# Patient Record
Sex: Male | Born: 1985 | Hispanic: No | Marital: Single | Smoking: Never smoker
Health system: Southern US, Community
[De-identification: ages and names within clinical notes are randomized; demographics above are authoritative.]

## PROBLEM LIST (undated history)

## (undated) DIAGNOSIS — N471 Phimosis: Secondary | ICD-10-CM

## (undated) HISTORY — PX: NO PAST SURGERIES: SHX2092

---

## 2012-01-31 ENCOUNTER — Emergency Department (INDEPENDENT_AMBULATORY_CARE_PROVIDER_SITE_OTHER): Payer: Self-pay

## 2012-01-31 ENCOUNTER — Emergency Department (INDEPENDENT_AMBULATORY_CARE_PROVIDER_SITE_OTHER)
Admission: EM | Admit: 2012-01-31 | Discharge: 2012-01-31 | Disposition: A | Discharge status: Home / Self Care | Payer: 59 | Source: Home / Self Care | Attending: Emergency Medicine | Admitting: Emergency Medicine

## 2012-01-31 ENCOUNTER — Encounter (HOSPITAL_COMMUNITY): Payer: Self-pay | Admitting: *Deleted

## 2012-01-31 DIAGNOSIS — S93409A Sprain of unspecified ligament of unspecified ankle, initial encounter: Secondary | ICD-10-CM

## 2012-01-31 MED ORDER — DICLOFENAC SODIUM 75 MG PO TBEC: 75.0000 mg | DELAYED_RELEASE_TABLET | Freq: Two times a day (BID) | ORAL | Status: AC

## 2012-01-31 NOTE — ED Notes (Signed)
Pt  Reports  While  Playing  Soccer  yest  He  Was  Running and  Twisted   His  r  Ankle  -  He  Has  Pain /  Swelling to  The  Affected  Ankle  He  Has  An ankle  Brace  On  Upon arrival          He  Is  Able  To  Bear  Weight on the  Affected  Ankle  He   denys  Any other  Injury

## 2012-01-31 NOTE — ED Provider Notes (Signed)
Chief Complaint  Patient presents with  . Ankle Pain    History of Present Illness:   The patient is a 26 year old [place] male who injured his left ankle yesterday playing soccer. He twisted the ankle and now has pain and swelling over the medial and lateral malleolus. He is able to walk but it hurts somewhat. He denies any numbness or tingling.  Review of Systems:  Other than noted above, the patient denies any of the following symptoms: Systemic:  No fevers, chills, sweats, or aches.  No fatigue or tiredness. Musculoskeletal:  No joint pain, arthritis, bursitis, swelling, back pain, or neck pain. Neurological:  No muscular weakness, paresthesias, headache, or trouble with speech or coordination.  No dizziness.   PMFSH:  Past medical history, family history, social history, meds, and allergies were reviewed.  Physical Exam:   Vital signs:  BP 128/89  Pulse 74  Temp 98.1 F (36.7 C) (Oral)  Resp 18  SpO2 100% Gen:  Alert and oriented times 3.  In no distress. Musculoskeletal: He has swelling and pain to palpation over the medial and lateral malleolar. Talar tilt and anterior drawer signs were negative. The ankle has a full range of motion with some pain on movement. Otherwise, all joints had a full a ROM with no swelling, bruising or deformity.  No edema, pulses full. Extremities were warm and pink.  Capillary refill was brisk.  Skin:  Clear, warm and dry.  No rash. Neuro:  Alert and oriented times 3.  Muscle strength was normal.  Sensation was intact to light touch.   Radiology:  Dg Ankle Complete Right  01/31/2012  *RADIOLOGY REPORT*  Clinical Data: Medial and lateral right ankle pain following a twisting injury yesterday.  RIGHT ANKLE - COMPLETE 3+ VIEW  Comparison: None.  Findings: Diffuse soft tissue swelling.  No fracture, dislocation or effusion.  IMPRESSION: No fracture.   Original Report Authenticated By: Darrol Angel, M.D.    I reviewed the images independently and  personally and concur with the radiologist's findings.  Course in Urgent Care Center:   The patient was placed in an ASO brace.  Assessment:  The encounter diagnosis was Ankle sprain.  Plan:   1.  The following meds were prescribed:   New Prescriptions   DICLOFENAC (VOLTAREN) 75 MG EC TABLET    Take 1 tablet (75 mg total) by mouth 2 (two) times daily.   2.  The patient was instructed in symptomatic care, including rest and activity, elevation, application of ice and compression.  Appropriate handouts were given. 3.  The patient was told to return if becoming worse in any way, if no better in 3 or 4 days, and given some red flag symptoms that would indicate earlier return.   4.  The patient was told to follow up with Dr. Leonides Grills if no better in 2 weeks.   Reuben Likes, MD 01/31/12 2059

## 2014-12-10 ENCOUNTER — Emergency Department (INDEPENDENT_AMBULATORY_CARE_PROVIDER_SITE_OTHER)
Admission: EM | Admit: 2014-12-10 | Discharge: 2014-12-10 | Disposition: A | Discharge status: Home / Self Care | Payer: 59 | Source: Home / Self Care | Attending: Emergency Medicine | Admitting: Emergency Medicine

## 2014-12-10 ENCOUNTER — Encounter (HOSPITAL_COMMUNITY): Payer: Self-pay | Admitting: *Deleted

## 2014-12-10 DIAGNOSIS — N471 Phimosis: Secondary | ICD-10-CM

## 2014-12-10 LAB — POCT URINALYSIS DIP (DEVICE)
Bilirubin Urine: NEGATIVE
Glucose, UA: NEGATIVE mg/dL
Hgb urine dipstick: NEGATIVE
Ketones, ur: NEGATIVE mg/dL
Leukocytes, UA: NEGATIVE
Nitrite: NEGATIVE
PH: 6 (ref 5.0–8.0)
PROTEIN: NEGATIVE mg/dL
UROBILINOGEN UA: 0.2 mg/dL (ref 0.0–1.0)

## 2014-12-10 MED ORDER — NYSTATIN 100000 UNIT/GM EX CREA: TOPICAL_CREAM | CUTANEOUS | Status: DC

## 2014-12-10 NOTE — ED Notes (Addendum)
PT  IS  UNCIRCUMSIZED  HE  REPORTS       HE    NOW  IS  DRIBBING    AND     THE  FORESKIN  IS  TIGHTER   HE  REPORTS  SOME ITCHING  AND  BURNING  PRESENT

## 2014-12-10 NOTE — ED Provider Notes (Signed)
CSN: [id number]     Arrival date & time 12/10/14  1328 History   First MD Initiated Contact with Patient 12/10/14 1444     Chief Complaint  Patient presents with  . Penis Pain   (Consider location/radiation/quality/duration/timing/severity/associated sxs/prior Treatment) HPI Comments: 29 year old male states that since he was 29 years old he has had problems retracting his foreskin. The older he gets the smaller the opening in his foreskin becomes. He denies penile drainage or any type of infection. There are no exudates, abnormal fluids, discoloration or bleeding.   History reviewed. No pertinent past medical history. History reviewed. No pertinent past surgical history. History reviewed. No pertinent family history. History  Substance Use Topics  . Smoking status: Never Smoker   . Smokeless tobacco: Not on file  . Alcohol Use: No    Review of Systems  Constitutional: Negative.   Genitourinary: Positive for difficulty urinating. Negative for urgency, frequency, hematuria, penile swelling, genital sores and testicular pain.  Neurological: Negative.   All other systems reviewed and are negative.   Allergies  Review of patient's allergies indicates no known allergies.  Home Medications   Prior to Admission medications   Medication Sig Start Date End Date Taking? Authorizing Provider  nystatin cream (MYCOSTATIN) Apply to affected area 2 times daily 12/10/14   Hayden Rasmussenavid Hau Sanor, NP   BP 122/76 mmHg  Pulse 77  Temp(Src) 97.9 F (36.6 C) (Oral)  Resp 16  SpO2 99% Physical Exam  Constitutional: He is oriented to person, place, and time. He appears well-developed and well-nourished. No distress.  Pulmonary/Chest: No respiratory distress.  Genitourinary: No penile tenderness.  Foreskin has approximately 2-3 mm opening over the glans. More than likely upon urinating there is retention of urine between the glans and the foreskin. This condition may be opportunistic for yeast type of  infection. There is no visual evidence such as erythema or drainage.  Neurological: He is alert and oriented to person, place, and time.  Skin: Skin is warm and dry.  Psychiatric: He has a normal mood and affect.  Nursing note and vitals reviewed.   ED Course  Procedures (including critical care time) Labs Review Labs Reviewed  POCT URINALYSIS DIP (DEVICE)    Imaging Review No results found.   MDM   1. Phimosis    Refer to urology Nystatin cream for possible candida infection    Hayden Rasmussenavid Cameran Ahmed, NP 12/10/14 1647

## 2014-12-10 NOTE — Discharge Instructions (Signed)
H?p bao quy ??u (Phimosis) H?p bao quy ??u l tnh tr?ng kht l?i (th?t l?i) vng da quy ??u ? ??u d??ng v?t. ? nam gi?i ch?a c?t bao quy ??u, bao quy ??u c th? b? h?p ??n m?c khng th? l?n qua ??u d??ng v?t. Tnh tr?ng ny ph? bi?n ? cc b trai (cho ??n 4 tu?i) nh?ng c?ng c th? x?y ra ? b?t k? ?? tu?i no. Khng c?n ?i?u tr? ngay l?p t?c khi v?n c th? ?i ti?u ???c. Tnh tr?ng ny c th? t? ?? ?i theo th?i gian. Tnh tr?ng ny c th? x?y ra sau khi b? nhi?m trng ho?c t?n th??ng, ho?c x?y ra do v? sinh km ? pha trong bao quy ??u.  ?I?U TR?  Vi?c ?i?u tr? h?p bao quy ??u th??ng b?t ??u khi tr? ???c 2 tu?i. Cc ?i?u tr? b?o t?n c th? bao g?m kem v thu?c m? steroid. C?t b? m?t ph?n bao quy ??u (c?t bao quy ??u) c th? c?n thi?t trong cc tr??ng h?p h?p n?ng d?n ??n vi?c cung c?p mu km ho?c khng cung c?p ???c mu ??n ??u d??ng v?t. H??NG D?N CH?M [place] T?I NH   Khng c? l?n bao quy ??u. Lm nh? v?y c th? gy s?o v lm tnh tr?ng tr?m tr?ng h?n.  Th??ng xuyn r?a s?ch pha trong bao quy ??u. H??ng d?n c? th? cho tr? em ? tr? em ch?a c?t bao quy ??u, bao quy ??u th??ng cht h?p. Kandis CockingBao quy ??u th??ng khng ?? r?ng ?? l?n v? pha sau cho ??n khi b ???c t nht 19 thng tu?i. Tr??c th?i ?i?m ?, ?i?u tr? theo h??ng d?n c?a chuyn gia ch?m [place] s?c kh?e. Sau ?, qu v? c th? ko nh? nhng da quy ??u trong lc t?m ?? r?a d??ng v?t.  ?I KHM N?U:   C v?t ??, s?ng, ho?c r? d?ch ? bao quy ??u. ?y l nh?ng d?u hi?u nhi?m trng.  ?au khi ?i ti?u. NGAY L?P T?C ?I KHM N?U:  Ch?a ?i ti?u ???c trong 24 ti?ng.  B? s?t. ??M B?O QU V?:  Hi?u r cc h??ng d?n ny.  S? theo di tnh tr?ng b?nh.  S? yu c?u tr? gip ngay n?u tnh tr?ng b?nh tr?m tr?ng h?n. Document Released: 05/24/2005 Document Revised: 05/29/2013 [facility] Patient Information 2015 [place], [place]. This information is not intended to replace advice given to you by your health care provider. Make sure you discuss any questions you  have with your health care provider.

## 2014-12-20 ENCOUNTER — Other Ambulatory Visit: Payer: Self-pay | Admitting: Urology

## 2015-01-02 ENCOUNTER — Encounter (HOSPITAL_BASED_OUTPATIENT_CLINIC_OR_DEPARTMENT_OTHER): Payer: Self-pay | Admitting: *Deleted

## 2015-01-03 ENCOUNTER — Encounter (HOSPITAL_BASED_OUTPATIENT_CLINIC_OR_DEPARTMENT_OTHER): Payer: Self-pay | Admitting: *Deleted

## 2015-01-03 NOTE — Progress Notes (Signed)
NPO AFTER MN. ARRIVE AT 0730. NEEDS HG. 

## 2015-01-06 ENCOUNTER — Ambulatory Visit (HOSPITAL_BASED_OUTPATIENT_CLINIC_OR_DEPARTMENT_OTHER)
Admission: RE | Admit: 2015-01-06 | Discharge: 2015-01-06 | Disposition: A | Discharge status: Home / Self Care | Payer: 59 | Source: Ambulatory Visit | Attending: Urology | Admitting: Urology

## 2015-01-06 ENCOUNTER — Ambulatory Visit (HOSPITAL_BASED_OUTPATIENT_CLINIC_OR_DEPARTMENT_OTHER): Payer: 59 | Admitting: Anesthesiology

## 2015-01-06 ENCOUNTER — Encounter (HOSPITAL_BASED_OUTPATIENT_CLINIC_OR_DEPARTMENT_OTHER)
Admission: RE | Disposition: A | Discharge status: Home / Self Care | Payer: Self-pay | Source: Ambulatory Visit | Attending: Urology

## 2015-01-06 ENCOUNTER — Encounter (HOSPITAL_BASED_OUTPATIENT_CLINIC_OR_DEPARTMENT_OTHER): Payer: Self-pay | Admitting: Anesthesiology

## 2015-01-06 DIAGNOSIS — F419 Anxiety disorder, unspecified: Secondary | ICD-10-CM | POA: Insufficient documentation

## 2015-01-06 DIAGNOSIS — Z79899 Other long term (current) drug therapy: Secondary | ICD-10-CM | POA: Diagnosis not present

## 2015-01-06 DIAGNOSIS — N471 Phimosis: Secondary | ICD-10-CM | POA: Insufficient documentation

## 2015-01-06 DIAGNOSIS — F329 Major depressive disorder, single episode, unspecified: Secondary | ICD-10-CM | POA: Diagnosis not present

## 2015-01-06 HISTORY — DX: Phimosis: N47.1

## 2015-01-06 HISTORY — PX: CIRCUMCISION: SHX1350

## 2015-01-06 LAB — POCT I-STAT 4, (NA,K, GLUC, HGB,HCT)
Glucose, Bld: 98 mg/dL (ref 65–99)
HCT: 52 % (ref 39.0–52.0)
Hemoglobin: 17.7 g/dL — ABNORMAL HIGH (ref 13.0–17.0)
Potassium: 4 mmol/L (ref 3.5–5.1)
Sodium: 142 mmol/L (ref 135–145)

## 2015-01-06 SURGERY — CIRCUMCISION, ADULT
Anesthesia: General | Site: Penis

## 2015-01-06 MED ORDER — CEFAZOLIN SODIUM-DEXTROSE 2-3 GM-% IV SOLR
2.0000 g | INTRAVENOUS | Status: AC
  Administered 2015-01-06: 2 g via INTRAVENOUS
  Filled 2015-01-06: qty 50

## 2015-01-06 MED ORDER — OXYCODONE HCL 10 MG PO TABS
10.0000 mg | ORAL_TABLET | ORAL | Status: DC | PRN
Start: 2015-01-06 — End: 2021-06-04

## 2015-01-06 MED ORDER — FENTANYL CITRATE (PF) 100 MCG/2ML IJ SOLN
INTRAMUSCULAR | Status: DC | PRN
  Administered 2015-01-06 (×8): 25 ug via INTRAVENOUS

## 2015-01-06 MED ORDER — LIDOCAINE HCL (CARDIAC) 20 MG/ML IV SOLN
INTRAVENOUS | Status: DC | PRN
  Administered 2015-01-06: 60 mg via INTRAVENOUS

## 2015-01-06 MED ORDER — FENTANYL CITRATE (PF) 100 MCG/2ML IJ SOLN
INTRAMUSCULAR | Status: AC
  Filled 2015-01-06: qty 4

## 2015-01-06 MED ORDER — MIDAZOLAM HCL 5 MG/5ML IJ SOLN
INTRAMUSCULAR | Status: DC | PRN
  Administered 2015-01-06 (×2): 1 mg via INTRAVENOUS

## 2015-01-06 MED ORDER — BUPIVACAINE HCL 0.5 % IJ SOLN
INTRAMUSCULAR | Status: DC | PRN
  Administered 2015-01-06: 10 mL

## 2015-01-06 MED ORDER — ONDANSETRON HCL 4 MG/2ML IJ SOLN
INTRAMUSCULAR | Status: DC | PRN
  Administered 2015-01-06: 4 mg via INTRAVENOUS

## 2015-01-06 MED ORDER — KETOROLAC TROMETHAMINE 30 MG/ML IJ SOLN
INTRAMUSCULAR | Status: DC | PRN
  Administered 2015-01-06: 30 mg via INTRAVENOUS

## 2015-01-06 MED ORDER — ACETAMINOPHEN 10 MG/ML IV SOLN
INTRAVENOUS | Status: DC | PRN
  Administered 2015-01-06: 1000 mg via INTRAVENOUS

## 2015-01-06 MED ORDER — MIDAZOLAM HCL 2 MG/2ML IJ SOLN
INTRAMUSCULAR | Status: AC
  Filled 2015-01-06: qty 4

## 2015-01-06 MED ORDER — CEFAZOLIN SODIUM 1-5 GM-% IV SOLN
1.0000 g | INTRAVENOUS | Status: DC
  Filled 2015-01-06: qty 50

## 2015-01-06 MED ORDER — DEXAMETHASONE SODIUM PHOSPHATE 10 MG/ML IJ SOLN
INTRAMUSCULAR | Status: DC | PRN
  Administered 2015-01-06: 10 mg via INTRAVENOUS

## 2015-01-06 MED ORDER — LACTATED RINGERS IV SOLN
INTRAVENOUS | Status: DC
  Administered 2015-01-06 (×2): via INTRAVENOUS
  Filled 2015-01-06: qty 1000

## 2015-01-06 MED ORDER — CEFAZOLIN SODIUM-DEXTROSE 2-3 GM-% IV SOLR
INTRAVENOUS | Status: AC
Start: 2015-01-06 — End: 2015-01-06
  Filled 2015-01-06: qty 50

## 2015-01-06 MED ORDER — BACITRACIN-NEOMYCIN-POLYMYXIN 400-5-5000 EX OINT
TOPICAL_OINTMENT | CUTANEOUS | Status: DC | PRN
  Administered 2015-01-06: 1 via TOPICAL

## 2015-01-06 MED ORDER — HYDROMORPHONE HCL 1 MG/ML IJ SOLN
0.2500 mg | INTRAMUSCULAR | Status: DC | PRN
  Filled 2015-01-06: qty 1

## 2015-01-06 MED ORDER — PROPOFOL 10 MG/ML IV BOLUS
INTRAVENOUS | Status: DC | PRN
  Administered 2015-01-06: 200 mg via INTRAVENOUS

## 2015-01-06 SURGICAL SUPPLY — 30 items
BANDAGE CO FLEX L/F 1IN X 5YD (GAUZE/BANDAGES/DRESSINGS) IMPLANT
BANDAGE CO FLEX L/F 2IN X 5YD (GAUZE/BANDAGES/DRESSINGS) ×2 IMPLANT
BLADE SURG 15 STRL LF DISP TIS (BLADE) ×1 IMPLANT
BLADE SURG 15 STRL SS (BLADE) ×1
BNDG CONFORM 2 STRL LF (GAUZE/BANDAGES/DRESSINGS) ×2 IMPLANT
COVER BACK TABLE 60X90IN (DRAPES) ×2 IMPLANT
COVER MAYO STAND STRL (DRAPES) ×2 IMPLANT
DRAPE PED LAPAROTOMY (DRAPES) ×2 IMPLANT
ELECT REM PT RETURN 9FT ADLT (ELECTROSURGICAL) ×2
ELECTRODE REM PT RTRN 9FT ADLT (ELECTROSURGICAL) ×1 IMPLANT
GLOVE BIO SURGEON STRL SZ8 (GLOVE) ×2 IMPLANT
GLOVE BIOGEL PI IND STRL 7.5 (GLOVE) ×1 IMPLANT
GLOVE BIOGEL PI INDICATOR 7.5 (GLOVE) ×1
GLOVE ECLIPSE 6.5 STRL STRAW (GLOVE) ×2 IMPLANT
GLOVE INDICATOR 6.5 STRL GRN (GLOVE) ×2 IMPLANT
GOWN STRL REUS W/ TWL LRG LVL3 (GOWN DISPOSABLE) ×1 IMPLANT
GOWN STRL REUS W/ TWL XL LVL3 (GOWN DISPOSABLE) ×1 IMPLANT
GOWN STRL REUS W/TWL LRG LVL3 (GOWN DISPOSABLE) ×1
GOWN STRL REUS W/TWL XL LVL3 (GOWN DISPOSABLE) ×1
NEEDLE HYPO 22GX1.5 SAFETY (NEEDLE) ×2 IMPLANT
NS IRRIG 500ML POUR BTL (IV SOLUTION) ×2 IMPLANT
PACK BASIN DAY SURGERY FS (CUSTOM PROCEDURE TRAY) ×2 IMPLANT
PENCIL BUTTON HOLSTER BLD 10FT (ELECTRODE) ×2 IMPLANT
SPONGE GAUZE 4X4 12PLY STER LF (GAUZE/BANDAGES/DRESSINGS) ×2 IMPLANT
SUT CHROMIC 3 0 SH 27 (SUTURE) ×4 IMPLANT
SUT CHROMIC 4 0 RB 1X27 (SUTURE) ×2 IMPLANT
SUT CHROMIC 5 0 RB 1 27 (SUTURE) IMPLANT
SYR CONTROL 10ML LL (SYRINGE) ×2 IMPLANT
TOWEL OR 17X24 6PK STRL BLUE (TOWEL DISPOSABLE) ×4 IMPLANT
TRAY DSU PREP LF (CUSTOM PROCEDURE TRAY) ×2 IMPLANT

## 2015-01-06 NOTE — Anesthesia Procedure Notes (Signed)
Procedure Name: LMA Insertion Date/Time: 01/06/2015 8:35 AM Performed by: Jessica Priest Pre-anesthesia Checklist: Patient identified, Emergency Drugs available, Suction available and Patient being monitored Patient Re-evaluated:Patient Re-evaluated prior to inductionOxygen Delivery Method: Circle System Utilized Preoxygenation: Pre-oxygenation with 100% oxygen Intubation Type: IV induction Ventilation: Mask ventilation without difficulty LMA: LMA inserted LMA Size: 4.0 Number of attempts: 1 Airway Equipment and Method: Bite block Placement Confirmation: positive ETCO2 Tube secured with: Tape Dental Injury: Teeth and Oropharynx as per pre-operative assessment

## 2015-01-06 NOTE — Anesthesia Postprocedure Evaluation (Signed)
  Anesthesia Post-op Note  Patient: Victor Richardson  Procedure(s) Performed: Procedure(s): CIRCUMCISION ADULT (N/A)  Patient Location: PACU  Anesthesia Type:General  Level of Consciousness: awake  Airway and Oxygen Therapy: Patient Spontanous Breathing  Post-op Pain: mild  Post-op Assessment: Post-op Vital signs reviewed              Post-op Vital Signs: Reviewed  Last Vitals:  Filed Vitals:   01/06/15 1030  BP: 120/87  Pulse: 82  Temp:   Resp: 14    Complications: No apparent anesthesia complications

## 2015-01-06 NOTE — Progress Notes (Signed)
Patient waiting for Dr. Vernie Ammons to come for work information

## 2015-01-06 NOTE — Discharge Instructions (Signed)
Postoperative instructions for circumcision ° °Wound: ° °In most cases your incision will have absorbable sutures that run along the course of your incision and will dissolve within the first 10-20 days. Some will fall out even earlier. Expect some redness as the sutures dissolved but this should occur only around the sutures. If there is generalized redness, especially with increasing pain or swelling, let us know. The penis will very likely get "black and blue" as the blood in the tissues spread. Sometimes the whole penis will turn colors. The black and blue is followed by a yellow and brown color. In time, all the discoloration will go away. ° °Diet: ° °You may return to your normal diet within 24 hours following your surgery. You may note some mild nausea and possibly vomiting the first 6-8 hours following surgery. This is usually due to the side effects of anesthesia, and will disappear quite soon. I would suggest clear liquids and a very light meal the first evening following your surgery. ° °Activity: ° °Your physical activity should be restricted the first 48 hours. During that time you should remain relatively inactive, moving about only when necessary. During the first 7-10 days following surgery he should avoid lifting any heavy objects (anything greater than 15 pounds), and avoid strenuous exercise. If you work, ask us specifically about your restrictions, both for work and home. We will write a note to your employer if needed. ° °Ice packs can be placed on and off over the penis for the first 48 hours to help relieve the pain and keep the swelling down. Frozen peas or corn in a ZipLock bag can be frozen, used and re-frozen. Fifteen minutes on and 15 minutes off is a reasonable schedule.  ° °Hygiene: ° °You may shower 48 hours after your surgery. Tub bathing should be restricted until the seventh day. ° °Medication: ° °You will be sent home with some type of pain medication. In many cases you will be  sent home with a narcotic pain pill (Vicodin or Tylox). If the pain is not too bad, you may take either Tylenol (acetaminophen) or Advil (ibuprofen) which contain no narcotic agents, and might be tolerated a little better, with fewer side effects. If the pain medication you are sent home with does not control the pain, you will have to let us know. Some narcotic pain medications cannot be given or refilled by a phone call to a pharmacy. ° °Problems you should report to us: ° °· Fever of 101.0 degrees Fahrenheit or greater. °· Moderate or severe swelling under the skin incision or involving the scrotum. °· Drug reaction such as hives, a rash, nausea or vomiting.  ° ° ° ° °Post Anesthesia Home Care Instructions ° °Activity: °Get plenty of rest for the remainder of the day. A responsible adult should stay with you for 24 hours following the procedure.  °For the next 24 hours, DO NOT: °-Drive a car °-Operate machinery °-Drink alcoholic beverages °-Take any medication unless instructed by your physician °-Make any legal decisions or sign important papers. ° °Meals: °Start with liquid foods such as gelatin or soup. Progress to regular foods as tolerated. Avoid greasy, spicy, heavy foods. If nausea and/or vomiting occur, drink only clear liquids until the nausea and/or vomiting subsides. Call your physician if vomiting continues. ° °Special Instructions/Symptoms: °Your throat may feel dry or sore from the anesthesia or the breathing tube placed in your throat during surgery. If this causes discomfort, gargle with warm salt water.   The discomfort should disappear within 24 hours. ° °If you had a scopolamine patch placed behind your ear for the management of post- operative nausea and/or vomiting: ° °1. The medication in the patch is effective for 72 hours, after which it should be removed.  Wrap patch in a tissue and discard in the trash. Wash hands thoroughly with soap and water. °2. You may remove the patch earlier than 72  hours if you experience unpleasant side effects which may include dry mouth, dizziness or visual disturbances. °3. Avoid touching the patch. Wash your hands with soap and water after contact with the patch. °  ° °

## 2015-01-06 NOTE — Transfer of Care (Signed)
Immediate Anesthesia Transfer of Care Note  Patient: Victor Richardson  Procedure(s) Performed: Procedure(s) (LRB): CIRCUMCISION ADULT (N/A)  Patient Location: PACU  Anesthesia Type: General  Level of Consciousness: awake, sedated, patient cooperative and responds to stimulation  Airway & Oxygen Therapy: Patient Spontanous Breathing and Patient connected to face mask oxygen  Post-op Assessment: Report given to PACU RN, Post -op Vital signs reviewed and stable and Patient moving all extremities  Post vital signs: Reviewed and stable  Complications: No apparent anesthesia complications

## 2015-01-06 NOTE — Anesthesia Preprocedure Evaluation (Signed)
Anesthesia Evaluation  Patient identified by MRN, date of birth, ID band Patient awake    Reviewed: Allergy & Precautions, NPO status , Patient's Chart, lab work & pertinent test results  Airway Mallampati: I  TM Distance: >3 FB Neck ROM: Full    Dental   Pulmonary neg pulmonary ROS,  breath sounds clear to auscultation        Cardiovascular negative cardio ROS  Rhythm:Regular Rate:Normal     Neuro/Psych    GI/Hepatic negative GI ROS, Neg liver ROS,   Endo/Other  negative endocrine ROS  Renal/GU negative Renal ROS     Musculoskeletal   Abdominal   Peds  Hematology   Anesthesia Other Findings   Reproductive/Obstetrics                             Anesthesia Physical Anesthesia Plan  ASA: I  Anesthesia Plan: General   Post-op Pain Management:    Induction: Intravenous  Airway Management Planned: LMA  Additional Equipment:   Intra-op Plan:   Post-operative Plan: Extubation in OR  Informed Consent: I have reviewed the patients History and Physical, chart, labs and discussed the procedure including the risks, benefits and alternatives for the proposed anesthesia with the patient or authorized representative who has indicated his/her understanding and acceptance.   Dental advisory given  Plan Discussed with: CRNA and Anesthesiologist  Anesthesia Plan Comments:         Anesthesia Quick Evaluation  

## 2015-01-06 NOTE — H&P (Signed)
     History of Present Illness He presented to the emergency room on 12/20/14 with complaints of difficulty retracting his foreskin since the age of 33. This has worsened. Although no sign of infection or discharge he was placed on nystatin. His urine was also clear. He reports that each time he urinates his foreskin balloons out and he has to squeeze it in order to allow it to empty.    Past Medical History Problems  1. History of depression (Z86.59)  Surgical History Problems  1. History of No Surgical Problems  Current Meds 1. Benadryl TABS;  Therapy: (Recorded:12Jul2016) to Recorded  Allergies Medication  1. No Known Drug Allergies  Family History Problems  1. Family history of No significant past medical history : Mother  Social History Problems    Alcohol use (Z78.9)   Denied: History of Caffeine use   Never a smoker   Single  Review of Systems Genitourinary, constitutional, skin, eye, otolaryngeal, hematologic/lymphatic, cardiovascular, pulmonary, endocrine, musculoskeletal, gastrointestinal, neurological and psychiatric system(s) were reviewed and pertinent findings if present are noted and are otherwise negative.  Genitourinary: urinary frequency, nocturia and difficulty starting the urinary stream.  Gastrointestinal: constipation.  ENT: sore throat and sinus problems.  Psychiatric: anxiety and depression.    Vitals Vital Signs  Height: 5 ft 3 in Weight: 140 lb  BMI Calculated: 24.8 BSA Calculated: 1.66 Blood Pressure: 110 / 71 Heart Rate: 74  Physical Exam Constitutional: Well nourished and well developed . No acute distress.  ENT:. The ears and nose are normal in appearance.  Neck: The appearance of the neck is normal and no neck mass is present.  Pulmonary: No respiratory distress and normal respiratory rhythm and effort.  Cardiovascular: Heart rate and rhythm are normal . No peripheral edema.  Abdomen: The abdomen is soft and nontender. No masses  are palpated. No CVA tenderness. No hernias are palpable. No hepatosplenomegaly noted.  Genitourinary: Examination of the penis demonstrates no discharge, no masses, no lesions and a normal meatus. The scrotum is without lesions. The right epididymis is palpably normal and non-tender. The left epididymis is palpably normal and non-tender. The right testis is non-tender and without masses. The left testis is non-tender and without masses.  Lymphatics: The femoral and inguinal nodes are not enlarged or tender.  Skin: Normal skin turgor, no visible rash and no visible skin lesions.  Neuro/Psych:. Mood and affect are appropriate.    Results/Data Urine  COLOR YELLOW  APPEARANCE CLEAR  SPECIFIC GRAVITY 1.030  pH 5.5  GLUCOSE NEG mg/dL BILIRUBIN NEG  KETONE NEG mg/dL BLOOD NEG  PROTEIN NEG mg/dL UROBILINOGEN 0.2 mg/dL NITRITE NEG  LEUKOCYTE ESTERASE NEG      Assessment   We discussed the fact that he has a phimosis which appears to have been long-standing and is significant. I don't believe topical steroid therapy is likely to be effective although we did discuss this. Definitive management would be surgical and we therefore discussed circumcision in detail. I went over the procedure with him including the incision used, the risks and complications, the probability of success as well as the anticipated postoperative course. I have answered all his questions to his satisfaction and he has elected to proceed with surgery.   Plan   He will be scheduled for circumcision for treatment of his phimosis.

## 2015-01-06 NOTE — Op Note (Signed)
PATIENT:  Victor Richardson  PRE-OPERATIVE DIAGNOSIS: Phimosis  POST-OPERATIVE DIAGNOSIS: Same  PROCEDURE: Circumcision  SURGEON:  [place]  INDICATION: Victor Richardson is a 29 year old male who has a long-standing history of severe phimosis to the point where he has ballooning of the foreskin with urination. He is obviously unable to retract his foreskin in order to perform proper hygiene and we have discussed proceeding with circumcision which she has elected.  ANESTHESIA:  General  EBL:  Minimal  DRAINS: None  LOCAL MEDICATIONS USED:  Half percent Marcaine  SPECIMEN:    Description of procedure: After informed consent the patient was taken to the operating room and placed on the table in a supine position. General anesthesia was then administered. Once fully anesthetized the genitalia were sterilely prepped and draped in standard fashion. An official timeout was then performed.  I initially performed a dorsal penile block by injecting half-strength Marcaine in a standard fashion.  I placed a straight clamp through the opening of the phimotic foreskin and clamped this. It was then released and I used the Metzenbaums to perform a dorsal slit. I was able to then retract the foreskin and clean the glans and inner preputial skin with Betadine. I excised the foreskin although there was a great deal of scar that involved the skin all the way up to nearly the glans. This was excised. I then replaced the foreskin in its normal anatomic position and marked with a skin marker the location of the corona and incised the skin at this location. The foreskin was then excised using sharp technique and bleeding points on the shaft were cauterized with electrocautery.  I used 4-0 chromic to reapproximate the frenulum in the midline in an interrupted fashion. I then placed a 3-0 chromic U stitch at the 6:00 position and a second 3-0 chromic at the 12:00 position and reapproximated the skin edges with those 3-0  chromics in a running fashion.  Neosporin was applied to the incision and folded 4 x 4's wrapped loosely around the penis and secured with Coban. The patient was then awakened and taken to the recovery room in stable and satisfactory condition. He tolerated the procedure well with no intraoperative complications.  PLAN OF CARE: Discharge to home after PACU  PATIENT DISPOSITION:  PACU - hemodynamically stable.

## 2015-01-07 ENCOUNTER — Encounter (HOSPITAL_BASED_OUTPATIENT_CLINIC_OR_DEPARTMENT_OTHER): Payer: Self-pay | Admitting: Urology

## 2015-11-13 ENCOUNTER — Other Ambulatory Visit: Payer: Self-pay | Admitting: *Deleted

## 2015-11-13 ENCOUNTER — Ambulatory Visit
Admission: RE | Admit: 2015-11-13 | Discharge: 2015-11-13 | Disposition: A | Discharge status: Home / Self Care | Payer: 59 | Source: Ambulatory Visit | Attending: *Deleted | Admitting: *Deleted

## 2015-11-13 DIAGNOSIS — M79651 Pain in right thigh: Secondary | ICD-10-CM

## 2018-02-08 IMAGING — CR DG LUMBAR SPINE 2-3V
3 series · 3 of 3 positions shown · non-contrast
Comparison: None.

CLINICAL DATA: C/o LBP x 1 month after hiking in [place] / no trauma /
no radiation to legs / jdh 315

EXAM:
LUMBAR SPINE - 2-3 VIEW

[w lumbar spine ap]
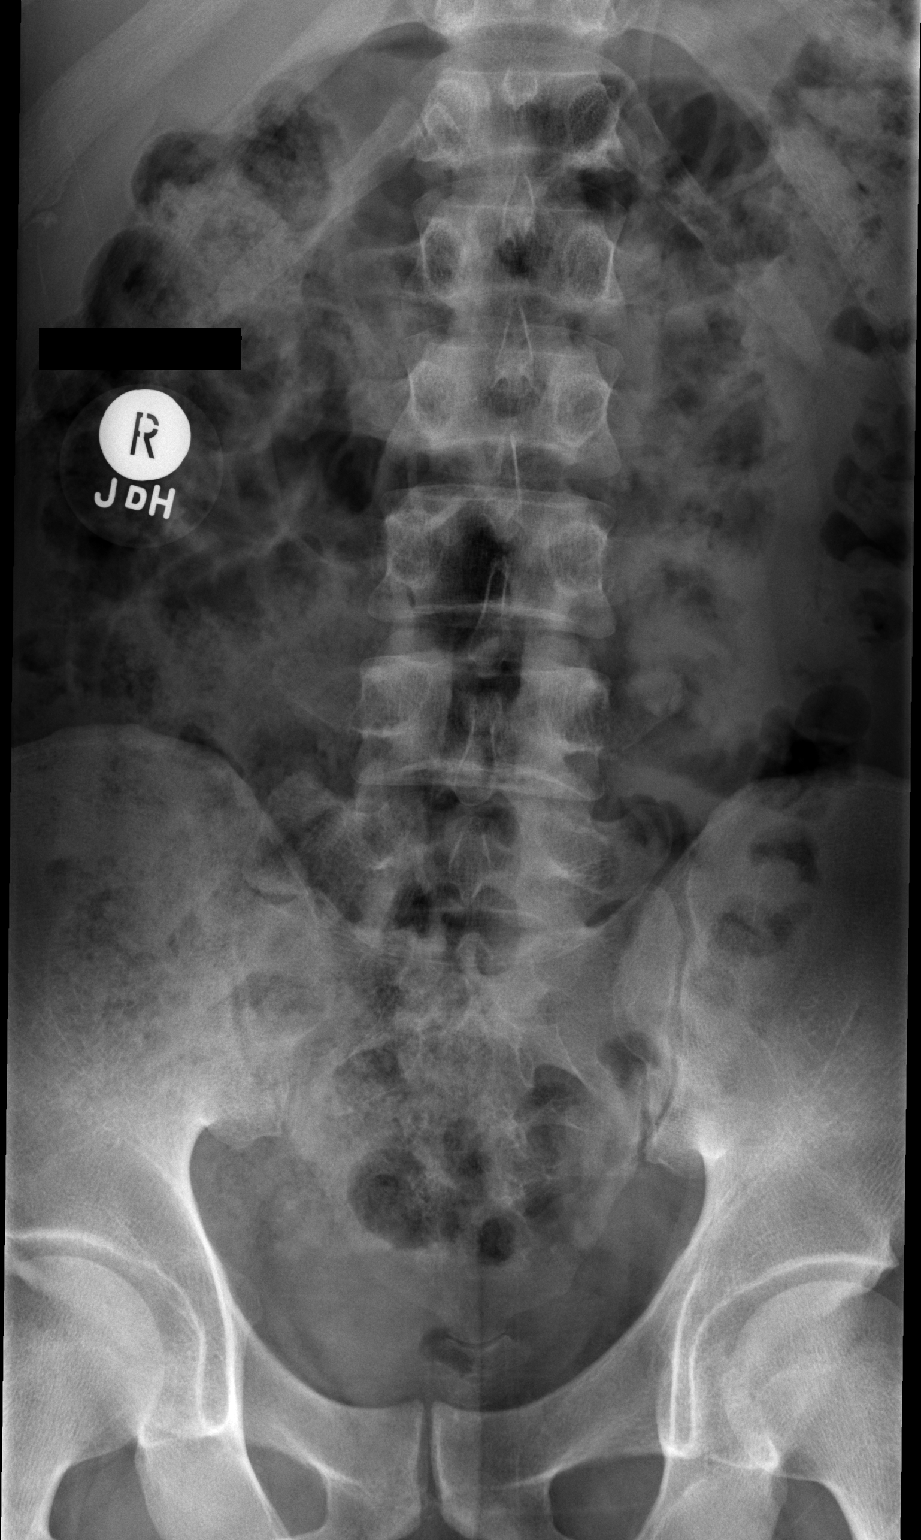

[w lumbar spine lat]
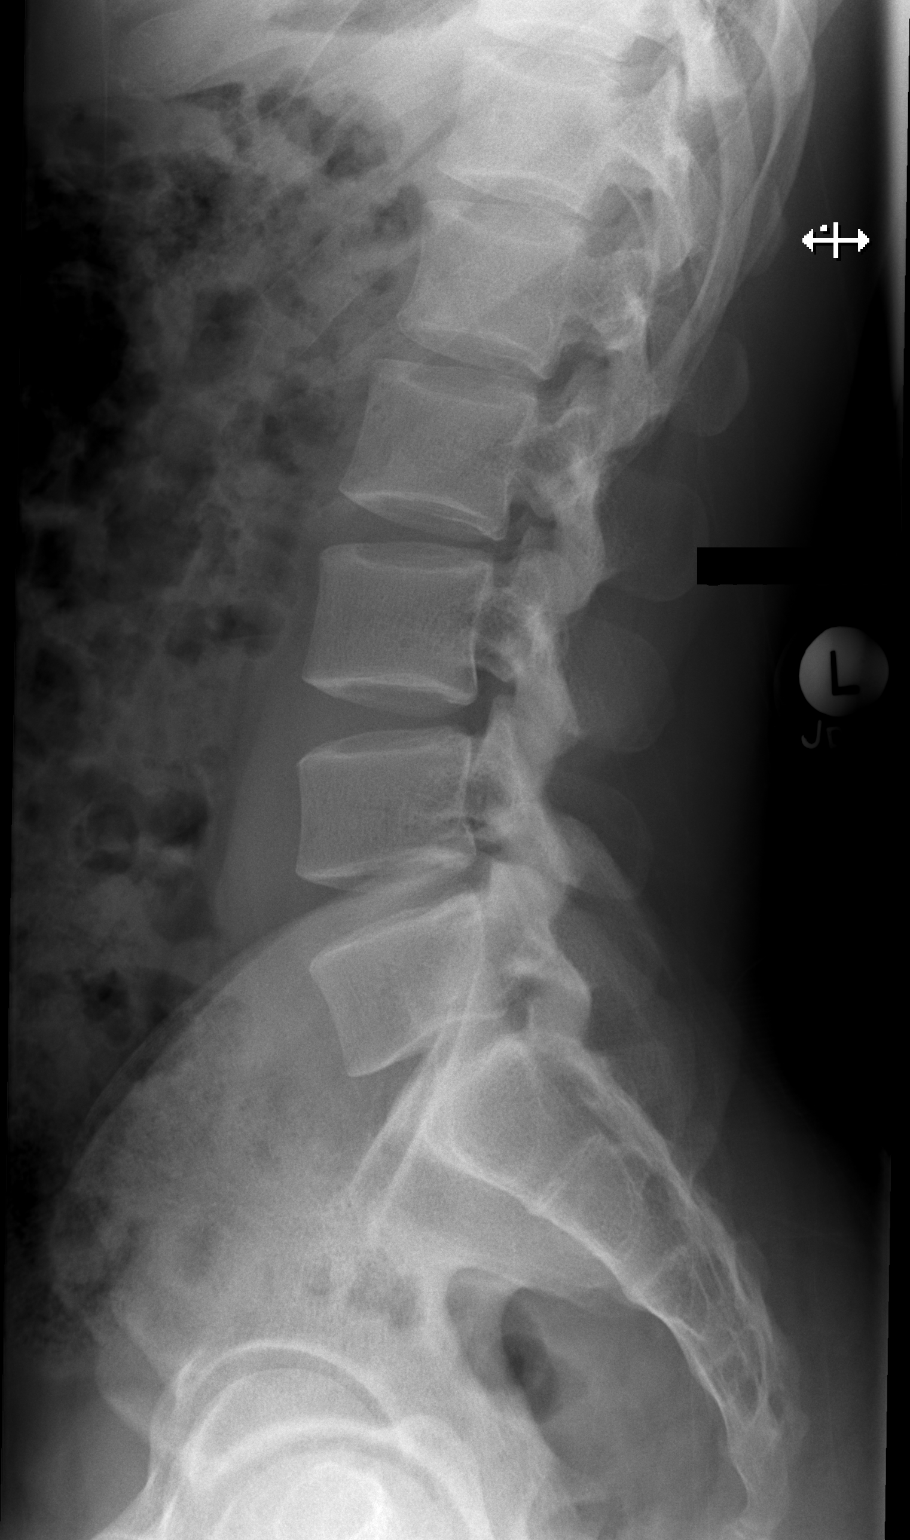

[w lumbar l-5 s-1 spot]
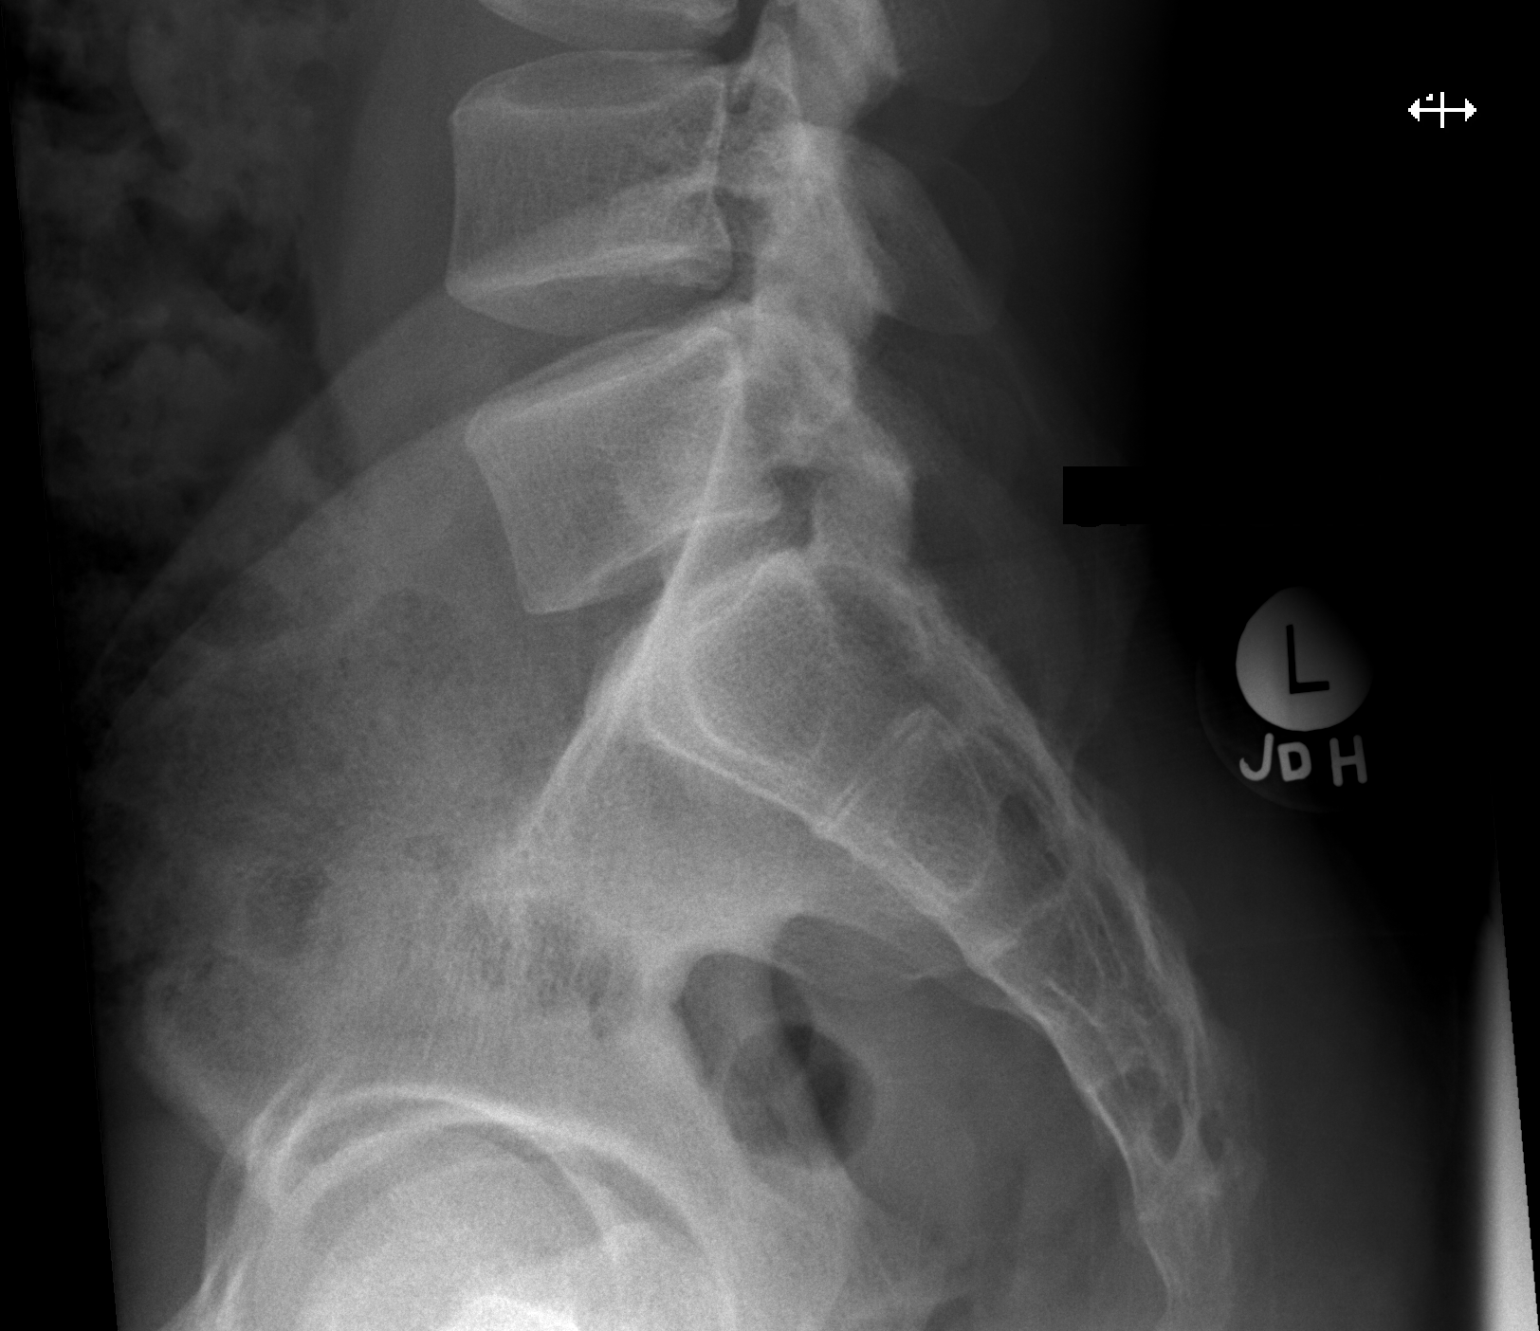

[3 of 3 positions shown; findings below may reference images not displayed]

FINDINGS: There is no evidence of lumbar spine fracture. Alignment is normal.
Intervertebral disc spaces are maintained.
IMPRESSION: Negative.

## 2021-05-24 ENCOUNTER — Encounter (HOSPITAL_COMMUNITY): Payer: Self-pay | Admitting: Emergency Medicine

## 2021-05-24 ENCOUNTER — Ambulatory Visit (HOSPITAL_COMMUNITY)
Admission: EM | Admit: 2021-05-24 | Discharge: 2021-05-24 | Disposition: A | Discharge status: Home / Self Care | Payer: BLUE CROSS/BLUE SHIELD

## 2021-05-24 ENCOUNTER — Emergency Department (HOSPITAL_COMMUNITY)
Admission: EM | Admit: 2021-05-24 | Discharge: 2021-05-24 | Disposition: A | Discharge status: Home / Self Care | Payer: 59 | Attending: Emergency Medicine | Admitting: Emergency Medicine

## 2021-05-24 ENCOUNTER — Other Ambulatory Visit: Payer: Self-pay

## 2021-05-24 DIAGNOSIS — L089 Local infection of the skin and subcutaneous tissue, unspecified: Secondary | ICD-10-CM

## 2021-05-24 DIAGNOSIS — Z23 Encounter for immunization: Secondary | ICD-10-CM | POA: Insufficient documentation

## 2021-05-24 DIAGNOSIS — L0889 Other specified local infections of the skin and subcutaneous tissue: Secondary | ICD-10-CM | POA: Insufficient documentation

## 2021-05-24 DIAGNOSIS — M79602 Pain in left arm: Secondary | ICD-10-CM

## 2021-05-24 DIAGNOSIS — S61432A Puncture wound without foreign body of left hand, initial encounter: Secondary | ICD-10-CM | POA: Diagnosis not present

## 2021-05-24 DIAGNOSIS — M7989 Other specified soft tissue disorders: Secondary | ICD-10-CM

## 2021-05-24 DIAGNOSIS — W270XXA Contact with workbench tool, initial encounter: Secondary | ICD-10-CM | POA: Insufficient documentation

## 2021-05-24 LAB — CBC WITH DIFFERENTIAL/PLATELET
Abs Immature Granulocytes: 0.06 10*3/uL (ref 0.00–0.07)
Basophils Absolute: 0 10*3/uL (ref 0.0–0.1)
Basophils Relative: 0 %
Eosinophils Absolute: 0.4 10*3/uL (ref 0.0–0.5)
Eosinophils Relative: 3 %
HCT: 46.3 % (ref 39.0–52.0)
Hemoglobin: 14.6 g/dL (ref 13.0–17.0)
Immature Granulocytes: 1 %
Lymphocytes Relative: 16 %
Lymphs Abs: 2.1 10*3/uL (ref 0.7–4.0)
MCH: 25.8 pg — ABNORMAL LOW (ref 26.0–34.0)
MCHC: 31.5 g/dL (ref 30.0–36.0)
MCV: 81.9 fL (ref 80.0–100.0)
Monocytes Absolute: 1.9 10*3/uL — ABNORMAL HIGH (ref 0.1–1.0)
Monocytes Relative: 14 %
Neutro Abs: 8.8 10*3/uL — ABNORMAL HIGH (ref 1.7–7.7)
Neutrophils Relative %: 66 %
Platelets: 179 10*3/uL (ref 150–400)
RBC: 5.65 MIL/uL (ref 4.22–5.81)
RDW: 12.9 % (ref 11.5–15.5)
WBC: 13.2 10*3/uL — ABNORMAL HIGH (ref 4.0–10.5)
nRBC: 0 % (ref 0.0–0.2)

## 2021-05-24 MED ORDER — IBUPROFEN 600 MG PO TABS: 600.0000 mg | ORAL_TABLET | Freq: Four times a day (QID) | ORAL | 0 refills | Status: DC | PRN

## 2021-05-24 MED ORDER — AMOXICILLIN-POT CLAVULANATE 875-125 MG PO TABS
1.0000 | ORAL_TABLET | Freq: Once | ORAL | Status: AC
  Administered 2021-05-24: 20:00:00 1 via ORAL
  Filled 2021-05-24: qty 1

## 2021-05-24 MED ORDER — IBUPROFEN 800 MG PO TABS
800.0000 mg | ORAL_TABLET | Freq: Once | ORAL | Status: AC
  Administered 2021-05-24: 20:00:00 800 mg via ORAL
  Filled 2021-05-24: qty 1

## 2021-05-24 MED ORDER — ACETAMINOPHEN 325 MG PO TABS
650.0000 mg | ORAL_TABLET | Freq: Once | ORAL | Status: AC | PRN
  Administered 2021-05-24: 17:00:00 650 mg via ORAL
  Filled 2021-05-24: qty 2

## 2021-05-24 MED ORDER — AMOXICILLIN-POT CLAVULANATE 875-125 MG PO TABS: 1.0000 | ORAL_TABLET | Freq: Two times a day (BID) | ORAL | 0 refills | Status: DC

## 2021-05-24 MED ORDER — TETANUS-DIPHTH-ACELL PERTUSSIS 5-2.5-18.5 LF-MCG/0.5 IM SUSY
0.5000 mL | PREFILLED_SYRINGE | Freq: Once | INTRAMUSCULAR | Status: AC
  Administered 2021-05-24: 20:00:00 0.5 mL via INTRAMUSCULAR
  Filled 2021-05-24: qty 0.5

## 2021-05-24 NOTE — ED Provider Notes (Signed)
Emergency Medicine Provider Triage Evaluation Note  Victor Richardson , a 35 y.o. male  was evaluated in triage.  Pt presents today with gradual swelling to left arm over the past 4-5 days. States that 2 weeks ago he accidentally stabbed himself with a screwdriver. He states that the area has been draining purulent fluid with callous formation. He then noted swelling around his elbow and in his axilla. Endorses fevers without n/v/d. No history of abscess  Review of Systems  Positive:  Negative: See above  Physical Exam  BP 116/75 (BP Location: Right Arm)    Pulse 91    Temp 99.5 F (37.5 C) (Oral)    Resp 18    SpO2 99%  Gen:   Awake, no distress   Resp:  Normal effort  MSK:   Moves extremities without difficulty  Other:  Non-draining calloused wound noted to the left palm. Erythema present. Area of induration without erythema or fluctuance noted to the left elbow and left axilla  Medical Decision Making  Medically screening exam initiated at 12:20 PM.  Appropriate orders placed.  Peace Karel was informed that the remainder of the evaluation will be completed by another provider, this initial triage assessment does not replace that evaluation, and the importance of remaining in the ED until their evaluation is complete.  Lymph node vs abscess to L elbow and shoulder   Silva Bandy, PA-C 05/24/21 1226    Margarita Grizzle, MD 05/25/21 [phone]

## 2021-05-24 NOTE — ED Triage Notes (Signed)
Pt sent from [facility].  Reports abscess with pain and swelling to L arm since Wednesday.  Knot above L elbow and knot to L axilla.  Pt also has wound to L hand from a screwdriver 2 weeks ago that is callused with redness.  Reports fever and chills since last night.

## 2021-05-24 NOTE — Discharge Instructions (Addendum)
You have been diagnosed with a wound infection causing several reactive lymphnodes.  Take antibiotic as prescribed.  Take ibuprofen as needed for fever and pain.  Return if you notice no improvement after 2-3 days.

## 2021-05-24 NOTE — Discharge Instructions (Signed)
°  Please go to the emergency department for further evaluation and treatment of your symptoms.

## 2021-05-24 NOTE — ED Notes (Signed)
Spoke to [employer], [place] about patient

## 2021-05-24 NOTE — ED Provider Notes (Signed)
[facility]    CSN: [id number] Arrival date & time: 05/24/21  1016      History   Chief Complaint Chief Complaint  Patient presents with   Arm Pain    HPI Victor Richardson is a 35 y.o. male.   HPI HPI Victor Richardson is a 35 y.o. male presenting to UC with c/o gradually worsening left arm pain and swelling in his underarm and near his Left elbow for 4-5 days.  He reports accidentally stabbing himself with a screwdriver about 1 week ago and notice a callous forming. He was able to drain some pus from the area on his hand but is concerned his arm is now hurting.  Subjective fever since yesterday.  Denies n/v/d. Denies numbness or weakness in his hand. Unsure of his last tetanus vaccine.     Past Medical History:  Diagnosis Date   Phimosis     There are no problems to display for this patient.   Past Surgical History:  Procedure Laterality Date   CIRCUMCISION N/A 01/06/2015   Procedure: CIRCUMCISION ADULT;  Surgeon: Ihor Gully, MD;  Location: [facility];  Service: Urology;  Laterality: N/A;   NO PAST SURGERIES         Home Medications    Prior to Admission medications   Medication Sig Start Date End Date Taking? Authorizing Provider  acetaminophen (TYLENOL) 325 MG tablet Take 650 mg by mouth every 6 (six) hours as needed.   Yes [provider]  naproxen sodium (ALEVE) 220 MG tablet Take 220 mg by mouth.   Yes [provider]  Oxycodone HCl 10 MG TABS Take 1 tablet (10 mg total) by mouth every 4 (four) hours as needed. Patient not taking: Reported on 05/24/2021 01/06/15   Ihor Gully, MD    Family History Family History  Problem Relation Age of Onset   Healthy Mother     Social History Social History   Tobacco Use   Smoking status: Never   Smokeless tobacco: Never  Vaping Use   Vaping Use: Never used  Substance Use Topics   Alcohol use: No   Drug use: No     Allergies   Patient has no known allergies.   Review of  Systems Review of Systems  Constitutional:  Positive for fever. Negative for appetite change.  Musculoskeletal:  Positive for arthralgias and joint swelling.    Physical Exam Triage Vital Signs ED Triage Vitals  Enc Vitals Group     BP 05/24/21 1112 (!) 119/56     Pulse Rate 05/24/21 1112 95     Resp 05/24/21 1112 18     Temp 05/24/21 1112 100 F (37.8 C)     Temp Source 05/24/21 1112 Oral     SpO2 05/24/21 1112 97 %     Weight --      Height --      Head Circumference --      Peak Flow --      Pain Score 05/24/21 1109 10     Pain Loc --      Pain Edu? --      Excl. in GC? --    No data found.  Updated Vital Signs BP (!) 119/56 (BP Location: Right Arm)    Pulse 95    Temp 100 F (37.8 C) (Oral)    Resp 18    SpO2 97%   Visual Acuity Right Eye Distance:   Left Eye Distance:   Bilateral Distance:  Right Eye Near:   Left Eye Near:    Bilateral Near:     Physical Exam Vitals and nursing note reviewed.  Constitutional:      Appearance: Normal appearance.  HENT:     Head: Normocephalic.  Cardiovascular:     Rate and Rhythm: Normal rate.     Pulses:          Radial pulses are 2+ on the left side.  Musculoskeletal:        General: Swelling and tenderness present. Normal range of motion.     Cervical back: Normal range of motion.     Comments: Left axilla: fist sized tender mass with some fluctuance. Skin in tact, no erythema or warmth. Full ROM Left shoulder. Left elbow: grape sized tender hard mass proximal to medial epicondyle. Full ROM elbow.  Left hand and wrist: full ROM, mild tenderness at distal aspect third metacarpal, callused skin.   Skin:    General: Skin is warm and dry.     Capillary Refill: Capillary refill takes less than 2 seconds.     Findings: No bruising or erythema.  Neurological:     General: No focal deficit present.     Mental Status: He is alert.  Psychiatric:        Mood and Affect: Mood normal.     UC Treatments / Results   Labs (all labs ordered are listed, but only abnormal results are displayed) Labs Reviewed - No data to display  EKG   Radiology No results found.  Procedures Procedures (including critical care time)  Medications Ordered in UC Medications - No data to display  Initial Impression / Assessment and Plan / UC Course  I have reviewed the triage vital signs and the nursing notes.  Pertinent labs & imaging results that were available during my care of the patient were reviewed by me and considered in my medical decision making (see chart for details).     Pt c/o 4-5 days of worsening Left arm pain and two masses including large mass in Left axilla and smaller tender mass just proximal to medial epicondyle following puncture wound to Left hand by a screwdriver a week ago. Due to MOI preceding the large mass, recommend further evaluation in emergency department.  Pt understanding and agreeable with plan.   Final Clinical Impressions(s) / UC Diagnoses   Final diagnoses:  Left arm pain  Left arm swelling  Left axillary swelling     Discharge Instructions       Please go to the emergency department for further evaluation and treatment of your symptoms.       ED Prescriptions   None    PDMP not reviewed this encounter.   Noe Gens, [place] 05/24/21 1220

## 2021-05-24 NOTE — ED Triage Notes (Addendum)
Left arm pain started Wednesday, swelling was reported and pain.   Patient has large swollen area under left arm and large red areas to left arm, painful Patient has an old puncture wound to left palm.  Injured with screw driver about 2 weeks ago, wound on palm is callused and redness around the area.    Last night feverish Left radial pulse 2 +, able to move fingers and arm

## 2021-05-24 NOTE — ED Provider Notes (Deleted)
[facility]    CSN: [id number] Arrival date & time: 05/24/21  1145      History   Chief Complaint Chief Complaint  Patient presents with   Abscess    HPI Wayden Jacuinde is a 35 y.o. male.   HPI Ashton Brubacher is a 35 y.o. male presenting to UC with c/o gradually worsening left arm pain and swelling in his underarm and near his Left elbow for 4-5 days.  He reports accidentally stabbing himself with a screwdriver about 1 week ago and notice a callous forming. He was able to drain some pus from the area on his hand but is concerned his arm is now hurting.  Subjective fever since yesterday.  Denies n/v/d. Denies numbness or weakness in his hand. Unsure of his last tetanus vaccine.    Past Medical History:  Diagnosis Date   Phimosis     There are no problems to display for this patient.   Past Surgical History:  Procedure Laterality Date   CIRCUMCISION N/A 01/06/2015   Procedure: CIRCUMCISION ADULT;  Surgeon: Ihor Gully, MD;  Location: [facility];  Service: Urology;  Laterality: N/A;   NO PAST SURGERIES         Home Medications    Prior to Admission medications   Medication Sig Start Date End Date Taking? Authorizing Provider  acetaminophen (TYLENOL) 325 MG tablet Take 650 mg by mouth every 6 (six) hours as needed.    [provider]  naproxen sodium (ALEVE) 220 MG tablet Take 220 mg by mouth.    [provider]  Oxycodone HCl 10 MG TABS Take 1 tablet (10 mg total) by mouth every 4 (four) hours as needed. Patient not taking: Reported on 05/24/2021 01/06/15   Ihor Gully, MD    Family History Family History  Problem Relation Age of Onset   Healthy Mother     Social History Social History   Tobacco Use   Smoking status: Never   Smokeless tobacco: Never  Vaping Use   Vaping Use: Never used  Substance Use Topics   Alcohol use: No   Drug use: No     Allergies   Patient has no known allergies.   Review of  Systems Review of Systems  Constitutional:  Positive for chills and fever.  Musculoskeletal:  Positive for arthralgias and joint swelling.  Skin:  Positive for wound. Negative for color change.    Physical Exam Triage Vital Signs ED Triage Vitals [05/24/21 1208]  Enc Vitals Group     BP      Pulse      Resp      Temp      Temp src      SpO2      Weight      Height      Head Circumference      Peak Flow      Pain Score 10     Pain Loc      Pain Edu?      Excl. in GC?    No data found.  Updated Vital Signs BP 116/75 (BP Location: Right Arm)    Pulse 91    Temp 99.5 F (37.5 C) (Oral)    Resp 18    SpO2 99%   Visual Acuity Right Eye Distance:   Left Eye Distance:   Bilateral Distance:    Right Eye Near:   Left Eye Near:    Bilateral Near:     Physical  Exam Vitals and nursing note reviewed.  Constitutional:      Appearance: Normal appearance. He is normal weight.  HENT:     Head: Normocephalic and atraumatic.  Cardiovascular:     Rate and Rhythm: Normal rate and regular rhythm.     Pulses:          Radial pulses are 2+ on the left side.  Pulmonary:     Effort: Pulmonary effort is normal. No respiratory distress.  Musculoskeletal:        General: Swelling and tenderness present. Normal range of motion.     Comments: Left axilla: fist sized tender mass with some fluctuance. Skin in tact, no erythema or warmth. Full ROM Left shoulder. Left elbow: grape sized tender hard mass proximal to medial epicondyle. Full ROM elbow.  Left hand and wrist: full ROM, mild tenderness at distal aspect third metacarpal, callused skin.   Skin:    General: Skin is warm and dry.     Capillary Refill: Capillary refill takes less than 2 seconds.  Neurological:     General: No focal deficit present.     Mental Status: He is alert.  Psychiatric:        Mood and Affect: Mood normal.     UC Treatments / Results  Labs (all labs ordered are listed, but only abnormal results are  displayed) Labs Reviewed - No data to display  EKG   Radiology No results found.  Procedures Procedures (including critical care time)  Medications Ordered in UC Medications - No data to display  Initial Impression / Assessment and Plan / UC Course  I have reviewed the triage vital signs and the nursing notes.  Pertinent labs & imaging results that were available during my care of the patient were reviewed by me and considered in my medical decision making (see chart for details).     Pt c/o 4-5 days of worsening Left arm pain and two masses including large mass in Left axilla and smaller tender mass just proximal to medial epicondyle following puncture wound to Left hand by a screwdriver a week ago. Due to MOI preceding the large mass, recommend further evaluation in emergency department.  Pt understanding and agreeable with plan.   Final Clinical Impressions(s) / UC Diagnoses   Final diagnoses:  None   Discharge Instructions   None    ED Prescriptions   None    PDMP not reviewed this encounter.   Noe Gens, [place] 05/24/21 1216

## 2021-05-24 NOTE — ED Provider Notes (Signed)
[facility] EMERGENCY DEPARTMENT Provider Note   CSN: [id number] Arrival date & time: 05/24/21  1145     History Chief Complaint  Patient presents with   Abscess    Dysen Yin is a 35 y.o. male.  The history is provided by the patient and medical records. No language interpreter was used.  Abscess  35 year old male sent here from urgent care center for evaluation of skin infection.  Patient reports several weeks ago he accidentally stabbed himself with a screwdriver.  The wound was to the palm of his left hand.  Since then he noticed increasing pain to the affected sites as well as having swollen lymph node along his left arm.  Pain is sharp throbbing moderate in severity.  He also endorsed fever and chills but now improved with taking over-the-counter Tylenol and ibuprofen.  He is unsure of his last tetanus status.  Went to urgent care center today but sent here for further evaluation.  He denies headache, nausea or vomiting.  No other complaint.  Patient report he is left-hand dominant.  Past Medical History:  Diagnosis Date   Phimosis     There are no problems to display for this patient.   Past Surgical History:  Procedure Laterality Date   CIRCUMCISION N/A 01/06/2015   Procedure: CIRCUMCISION ADULT;  Surgeon: Ihor Gully, MD;  Location: [facility];  Service: Urology;  Laterality: N/A;   NO PAST SURGERIES         Family History  Problem Relation Age of Onset   Healthy Mother     Social History   Tobacco Use   Smoking status: Never   Smokeless tobacco: Never  Vaping Use   Vaping Use: Never used  Substance Use Topics   Alcohol use: No   Drug use: No    Home Medications Prior to Admission medications   Medication Sig Start Date End Date Taking? Authorizing Provider  acetaminophen (TYLENOL) 325 MG tablet Take 650 mg by mouth every 6 (six) hours as needed.    [provider]  naproxen sodium (ALEVE) 220 MG tablet Take  220 mg by mouth.    [provider]  Oxycodone HCl 10 MG TABS Take 1 tablet (10 mg total) by mouth every 4 (four) hours as needed. Patient not taking: Reported on 05/24/2021 01/06/15   Ihor Gully, MD    Allergies    Patient has no known allergies.  Review of Systems   Review of Systems  All other systems reviewed and are negative.  Physical Exam Updated Vital Signs BP 115/68    Pulse 85    Temp 98.4 F (36.9 C) (Oral)    Resp (!) 22    SpO2 96%   Physical Exam Vitals and nursing note reviewed.  Constitutional:      General: He is not in acute distress.    Appearance: He is well-developed.     Comments: Patient with nontoxic in appearance  HENT:     Head: Atraumatic.     Mouth/Throat:     Mouth: Mucous membranes are moist.  Eyes:     Conjunctiva/sclera: Conjunctivae normal.  Cardiovascular:     Rate and Rhythm: Normal rate and regular rhythm.     Pulses: Normal pulses.  Pulmonary:     Effort: Pulmonary effort is normal.     Breath sounds: Normal breath sounds.  Abdominal:     Palpations: Abdomen is soft.     Tenderness: There is no abdominal tenderness.  Musculoskeletal:        General: Signs of injury (Left hand: There is a small stab wound noted to the palmar aspect of the head proximal to the third metacarpal region with mild surrounding erythema but no obvious induration or fluctuance noted.  No lymphangitis.  Able to make a fist.) present.     Cervical back: Neck supple.     Comments: Shotty reactive lymph nodes noted to the left bicipital groove as well as left axillary region tender to palpation  Skin:    Findings: No rash.  Neurological:     Mental Status: He is alert and oriented to person, place, and time.  Psychiatric:        Mood and Affect: Mood normal.    ED Results / Procedures / Treatments   Labs (all labs ordered are listed, but only abnormal results are displayed) Labs Reviewed  CBC WITH DIFFERENTIAL/PLATELET - Abnormal; Notable for the  following components:      Result Value   WBC 13.2 (*)    MCH 25.8 (*)    Neutro Abs 8.8 (*)    Monocytes Absolute 1.9 (*)    All other components within normal limits    EKG None  Radiology No results found.  Procedures Procedures   Medications Ordered in ED Medications  acetaminophen (TYLENOL) tablet 650 mg (650 mg Oral Given 05/24/21 1646)  Tdap (BOOSTRIX) injection 0.5 mL (0.5 mLs Intramuscular Given 05/24/21 2018)  amoxicillin-clavulanate (AUGMENTIN) 875-125 MG per tablet 1 tablet (1 tablet Oral Given 05/24/21 2018)  ibuprofen (ADVIL) tablet 800 mg (800 mg Oral Given 05/24/21 2018)    ED Course  I have reviewed the triage vital signs and the nursing notes.  Pertinent labs & imaging results that were available during my care of the patient were reviewed by me and considered in my medical decision making (see chart for details).    MDM Rules/Calculators/A&P                         BP 115/68    Pulse 85    Temp 98.4 F (36.9 C) (Oral)    Resp (!) 22    SpO2 96%      Final Clinical Impression(s) / ED Diagnoses Final diagnoses:  Puncture wound of left hand with infection, initial encounter    Rx / DC Orders ED Discharge Orders          Ordered    amoxicillin-clavulanate (AUGMENTIN) 875-125 MG tablet  Every 12 hours        05/24/21 2033    ibuprofen (ADVIL) 600 MG tablet  Every 6 hours PRN        05/24/21 2033           7:57 PM Patient excellently stabbed himself with a screwdriver several weeks prior.  Entry wound to his left hand on the palmar region.  He is here with pain and reactive lymph nodes along his left arm as well as having fever.  He also has an elevated white count of 13.2.  Fever improved with Tylenol.  Patient overall well-appearing, no abscess amenable for incision and drainage. Does not think pt need to be admitted for sepsis. He will benefit from tetanus update as well as antibiotic.  8:32 PM Care discussed with DR. Denton Lank.  Will d/c  home with augmentin and return precaution given.     Fayrene Helper, PA-C 05/24/21 2034    Cathren Laine, MD 05/24/21 2159

## 2021-05-24 NOTE — ED Notes (Signed)
Patient is being discharged from the Urgent Care and sent to the Emergency Department via pov . Per Waylan Rocher, PA, patient is in need of higher level of care due to limited resources to evaluate multiple areas of swelling in axilla and arm. Patient is aware and verbalizes understanding of plan of care.  Vitals:   05/24/21 1112  BP: (!) 119/56  Pulse: 95  Resp: 18  Temp: 100 F (37.8 C)  SpO2: 97%

## 2021-06-01 ENCOUNTER — Encounter (HOSPITAL_COMMUNITY): Payer: Self-pay | Admitting: Emergency Medicine

## 2021-06-01 ENCOUNTER — Other Ambulatory Visit: Payer: Self-pay

## 2021-06-01 ENCOUNTER — Ambulatory Visit (HOSPITAL_COMMUNITY)
Admission: EM | Admit: 2021-06-01 | Discharge: 2021-06-01 | Disposition: A | Discharge status: Home / Self Care | Payer: Self-pay | Attending: Physician Assistant | Admitting: Physician Assistant

## 2021-06-01 DIAGNOSIS — R591 Generalized enlarged lymph nodes: Secondary | ICD-10-CM | POA: Insufficient documentation

## 2021-06-01 DIAGNOSIS — L089 Local infection of the skin and subcutaneous tissue, unspecified: Secondary | ICD-10-CM | POA: Insufficient documentation

## 2021-06-01 LAB — COMPREHENSIVE METABOLIC PANEL
ALT: 78 U/L — ABNORMAL HIGH (ref 0–44)
AST: 41 U/L (ref 15–41)
Albumin: 3.2 g/dL — ABNORMAL LOW (ref 3.5–5.0)
Alkaline Phosphatase: 90 U/L (ref 38–126)
Anion gap: 8 (ref 5–15)
BUN: 10 mg/dL (ref 6–20)
CO2: 26 mmol/L (ref 22–32)
Calcium: 9.1 mg/dL (ref 8.9–10.3)
Chloride: 101 mmol/L (ref 98–111)
Creatinine, Ser: 1.11 mg/dL (ref 0.61–1.24)
GFR, Estimated: >60 mL/min (ref >60)
Glucose, Bld: 99 mg/dL (ref 70–99)
Potassium: 4.5 mmol/L (ref 3.5–5.1)
Sodium: 135 mmol/L (ref 135–145)
Total Bilirubin: 0.9 mg/dL (ref 0.3–1.2)
Total Protein: 8.4 g/dL — ABNORMAL HIGH (ref 6.5–8.1)

## 2021-06-01 LAB — CBC WITH DIFFERENTIAL/PLATELET
Abs Immature Granulocytes: 0.06 10*3/uL (ref 0.00–0.07)
Basophils Absolute: 0 10*3/uL (ref 0.0–0.1)
Basophils Relative: 1 %
Eosinophils Absolute: 0.4 10*3/uL (ref 0.0–0.5)
Eosinophils Relative: 5 %
HCT: 46.9 % (ref 39.0–52.0)
Hemoglobin: 14.7 g/dL (ref 13.0–17.0)
Immature Granulocytes: 1 %
Lymphocytes Relative: 21 %
Lymphs Abs: 1.8 10*3/uL (ref 0.7–4.0)
MCH: 25.6 pg — ABNORMAL LOW (ref 26.0–34.0)
MCHC: 31.3 g/dL (ref 30.0–36.0)
MCV: 81.7 fL (ref 80.0–100.0)
Monocytes Absolute: 1.2 10*3/uL — ABNORMAL HIGH (ref 0.1–1.0)
Monocytes Relative: 14 %
Neutro Abs: 5.1 10*3/uL (ref 1.7–7.7)
Neutrophils Relative %: 58 %
Platelets: 316 10*3/uL (ref 150–400)
RBC: 5.74 MIL/uL (ref 4.22–5.81)
RDW: 13.2 % (ref 11.5–15.5)
WBC: 8.5 10*3/uL (ref 4.0–10.5)
nRBC: 0 % (ref 0.0–0.2)

## 2021-06-01 MED ORDER — CEPHALEXIN 500 MG PO CAPS: 500.0000 mg | ORAL_CAPSULE | Freq: Three times a day (TID) | ORAL | 0 refills | Status: DC

## 2021-06-01 MED ORDER — SULFAMETHOXAZOLE-TRIMETHOPRIM 800-160 MG PO TABS: 1.0000 | ORAL_TABLET | Freq: Two times a day (BID) | ORAL | 0 refills | Status: DC

## 2021-06-01 NOTE — Discharge Instructions (Signed)
I am concerned that you have an infection that has spread out to your lymph nodes.  Please start antibiotics as prescribed.  It is very important that you are seen within 48 hours so if you have difficulty getting in with infectious disease please come back and see [place].  If you have any worsening symptoms or if symptoms or not improving you should go to the emergency room as we discussed.  If you develop fever, enlarging lesions, spread of rash, nausea/vomiting you must go to the ER as we discussed.

## 2021-06-01 NOTE — ED Triage Notes (Signed)
Pt reports that was seen here few weeks ago for left arm swelling and pain but still ongoing.Marland Kitchen

## 2021-06-01 NOTE — ED Provider Notes (Signed)
[facility]    CSN: [id number] Arrival date & time: 06/01/21  0901      History   Chief Complaint Chief Complaint  Patient presents with   Arm Swelling    HPI Victor Richardson is a 35 y.o. male.   Patient presents today with ongoing painful lesions of his left arm.  Patient was seen on 05/24/2021 after accidentally injuring himself with a screwdriver in his left hand.  Soon after this injury he developed painful nodules at his left elbow and left axilla.  He was seen by our clinic but recommended going to the emergency room for further evaluation and management.  Lesions were attributed to enlarged lymph nodes patient was given tetanus booster as well as started on Augmentin after CBC was obtained that showed only mild leukocytosis.  Patient reports completing course of medication but continues to have enlarged and painful lesions.  He reports subjective fever but denies additional symptoms including body aches, nausea, vomiting, headache, dizziness.  He denies any unusual exposures including to water or soil prior to symptom onset.  He denies history of immunosuppression including HIV or diabetes.  Reports pain is severe and rated 10 on a 0-10 pain scale, localized to lesions, described as sharp, no alleviating factors identified.   Past Medical History:  Diagnosis Date   Phimosis     There are no problems to display for this patient.   Past Surgical History:  Procedure Laterality Date   CIRCUMCISION N/A 01/06/2015   Procedure: CIRCUMCISION ADULT;  Surgeon: Ihor Gully, MD;  Location: [facility];  Service: Urology;  Laterality: N/A;   NO PAST SURGERIES         Home Medications    Prior to Admission medications   Medication Sig Start Date End Date Taking? Authorizing Provider  cephALEXin (KEFLEX) 500 MG capsule Take 1 capsule (500 mg total) by mouth 3 (three) times daily for 14 days. 06/01/21 06/15/21 Yes Yaris Ferrell K, PA-C   sulfamethoxazole-trimethoprim (BACTRIM DS) 800-160 MG tablet Take 1 tablet by mouth 2 (two) times daily for 14 days. 06/01/21 06/15/21 Yes Yeira Gulden, Noberto Retort, PA-C  acetaminophen (TYLENOL) 325 MG tablet Take 650 mg by mouth every 6 (six) hours as needed.    [provider]  ibuprofen (ADVIL) 600 MG tablet Take 1 tablet (600 mg total) by mouth every 6 (six) hours as needed for moderate pain. 05/24/21   Fayrene Helper, PA-C  naproxen sodium (ALEVE) 220 MG tablet Take 220 mg by mouth.    [provider]  Oxycodone HCl 10 MG TABS Take 1 tablet (10 mg total) by mouth every 4 (four) hours as needed. Patient not taking: Reported on 05/24/2021 01/06/15   Ihor Gully, MD    Family History Family History  Problem Relation Age of Onset   Healthy Mother     Social History Social History   Tobacco Use   Smoking status: Never   Smokeless tobacco: Never  Vaping Use   Vaping Use: Never used  Substance Use Topics   Alcohol use: No   Drug use: No     Allergies   Patient has no known allergies.   Review of Systems Review of Systems  Constitutional:  Positive for activity change, fatigue and fever. Negative for appetite change.  Respiratory:  Negative for cough and shortness of breath.   Cardiovascular:  Negative for chest pain.  Gastrointestinal:  Negative for abdominal pain, diarrhea, nausea and vomiting.  Musculoskeletal:  Positive for myalgias. Negative  for arthralgias.  Neurological:  Negative for dizziness, light-headedness and headaches.  Hematological:  Positive for adenopathy.    Physical Exam Triage Vital Signs ED Triage Vitals  Enc Vitals Group     BP 06/01/21 1011 117/72     Pulse Rate 06/01/21 1011 71     Resp 06/01/21 1011 15     Temp 06/01/21 1011 (!) 97.3 F (36.3 C)     Temp Source 06/01/21 1011 Oral     SpO2 06/01/21 1011 95 %     Weight --      Height --      Head Circumference --      Peak Flow --      Pain Score 06/01/21 1010 10     Pain Loc --       Pain Edu? --      Excl. in [place]? --    No data found.  Updated Vital Signs BP 117/72 (BP Location: Right Arm)    Pulse 71    Temp (!) 97.3 F (36.3 C) (Oral)    Resp 15    SpO2 95%   Visual Acuity Right Eye Distance:   Left Eye Distance:   Bilateral Distance:    Right Eye Near:   Left Eye Near:    Bilateral Near:     Physical Exam Vitals reviewed.  Constitutional:      General: He is awake.     Appearance: Normal appearance. He is well-developed. He is not ill-appearing.     Comments: Very pleasant male appears stated age in no acute distress sitting comfortably in exam room  HENT:     Head: Normocephalic and atraumatic.     Mouth/Throat:     Pharynx: Uvula midline. No oropharyngeal exudate, posterior oropharyngeal erythema or uvula swelling.  Cardiovascular:     Rate and Rhythm: Normal rate and regular rhythm.     Heart sounds: Normal heart sounds, S1 normal and S2 normal. No murmur heard.    Comments: Capillary refill within 2 seconds left fingers Pulmonary:     Effort: Pulmonary effort is normal.     Breath sounds: Normal breath sounds. No stridor. No wheezing, rhonchi or rales.     Comments: Clear to auscultation bilaterally Abdominal:     General: Bowel sounds are normal.     Palpations: Abdomen is soft.     Tenderness: There is no abdominal tenderness.     Comments: Benign abdominal exam  Musculoskeletal:     Left hand: No swelling, tenderness or bony tenderness. Normal range of motion. Normal strength. There is no disruption of two-point discrimination.     Comments: Left hand neurovascularly intact.  Normal pincer grip strength.  Normal active range of motion.  Lymphadenopathy:     Upper Body:     Left upper body: Axillary adenopathy and epitrochlear adenopathy present.     Comments: Left axilla: 5 cm x 4 cm painful nodule with central induration and without fluctuance.  No streaking noted on exam.  Left epitrochlear: 5 cm x 5 cm painful nodule with central  fluctuance.  Skin:    Findings: Rash present. Rash is macular and papular.     Comments: Maculopapular rash noted bilateral upper extremities and trunk.  Neurological:     Mental Status: He is alert.  Psychiatric:        Behavior: Behavior is cooperative.     UC Treatments / Results  Labs (all labs ordered are listed, but only abnormal results are displayed)  Labs Reviewed  CBC WITH DIFFERENTIAL/PLATELET - Abnormal; Notable for the following components:      Result Value   MCH 25.6 (*)    All other components within normal limits  COMPREHENSIVE METABOLIC PANEL    EKG   Radiology No results found.  Procedures Procedures (including critical care time)  Medications Ordered in UC Medications - No data to display  Initial Impression / Assessment and Plan / UC Course  I have reviewed the triage vital signs and the nursing notes.  Pertinent labs & imaging results that were available during my care of the patient were reviewed by me and considered in my medical decision making (see chart for details).     Unclear etiology of symptoms.  Discussed with patient that the safest thing to do would be to go to the emergency room for ultrasound or other imaging of enlarging lesions.  Patient has already been to the emergency room and is not interested in returning.  He is afebrile, nontachycardic, nontoxic-appearing in clinic today.  CBC was obtained which showed no significant leukocytosis.  Discussed with patient my concern for nodular lymphangitis given clinical presentation and recommended that if he is unwilling to go to the emergency room he follow-up with infectious disease.  He was given contact information for infectious disease clinic and instructed to call them to schedule appointment as soon as possible.  Discussed that someone should evaluate him in 48 hours and if he is unable to see ID in this timeframe he should return to our clinic.  He was started on Bactrim DS and Keflex  for 2 weeks.  Discussed at length that I recommend going to the emergency room.  He is agreeable to going to the ER if anything worsens we discussed alarm symptoms including fever, enlarging nodules, spread of rash, nausea/vomiting.  Strict return precautions given to which he expressed understanding.  He was provided work excuse note.  Final Clinical Impressions(s) / UC Diagnoses   Final diagnoses:  Skin infection  Lymphadenopathy     Discharge Instructions      I am concerned that you have an infection that has spread out to your lymph nodes.  Please start antibiotics as prescribed.  It is very important that you are seen within 48 hours so if you have difficulty getting in with infectious disease please come back and see [place].  If you have any worsening symptoms or if symptoms or not improving you should go to the emergency room as we discussed.  If you develop fever, enlarging lesions, spread of rash, nausea/vomiting you must go to the ER as we discussed.     ED Prescriptions     Medication Sig Dispense Auth. Provider   sulfamethoxazole-trimethoprim (BACTRIM DS) 800-160 MG tablet Take 1 tablet by mouth 2 (two) times daily for 14 days. 28 tablet Tynisa Vohs K, PA-C   cephALEXin (KEFLEX) 500 MG capsule Take 1 capsule (500 mg total) by mouth 3 (three) times daily for 14 days. 42 capsule Zoriyah Scheidegger K, PA-C      I have reviewed the PDMP during this encounter.   Terrilee Croak, PA-C 06/01/21 1148

## 2021-06-04 ENCOUNTER — Other Ambulatory Visit: Payer: Self-pay

## 2021-06-04 ENCOUNTER — Emergency Department (HOSPITAL_COMMUNITY): Payer: BC Managed Care – PPO

## 2021-06-04 ENCOUNTER — Encounter: Payer: Self-pay | Admitting: Infectious Diseases

## 2021-06-04 ENCOUNTER — Ambulatory Visit (INDEPENDENT_AMBULATORY_CARE_PROVIDER_SITE_OTHER): Payer: Self-pay | Admitting: Infectious Diseases

## 2021-06-04 ENCOUNTER — Inpatient Hospital Stay (HOSPITAL_COMMUNITY)
Admission: EM | Admit: 2021-06-04 | Discharge: 2021-06-08 | DRG: 580 | Disposition: A | Discharge status: Home / Self Care | Payer: BC Managed Care – PPO | Source: Ambulatory Visit | Attending: Internal Medicine | Admitting: Internal Medicine

## 2021-06-04 VITALS — BP 111/73 | HR 77 | Temp 97.3°F | Wt 143.4 lb

## 2021-06-04 DIAGNOSIS — L03119 Cellulitis of unspecified part of limb: Secondary | ICD-10-CM

## 2021-06-04 DIAGNOSIS — L02419 Cutaneous abscess of limb, unspecified: Secondary | ICD-10-CM | POA: Diagnosis not present

## 2021-06-04 DIAGNOSIS — L02414 Cutaneous abscess of left upper limb: Secondary | ICD-10-CM | POA: Diagnosis present

## 2021-06-04 DIAGNOSIS — L02412 Cutaneous abscess of left axilla: Secondary | ICD-10-CM | POA: Diagnosis not present

## 2021-06-04 DIAGNOSIS — L03114 Cellulitis of left upper limb: Secondary | ICD-10-CM | POA: Diagnosis not present

## 2021-06-04 DIAGNOSIS — M7989 Other specified soft tissue disorders: Secondary | ICD-10-CM

## 2021-06-04 DIAGNOSIS — Z20822 Contact with and (suspected) exposure to covid-19: Secondary | ICD-10-CM | POA: Diagnosis present

## 2021-06-04 DIAGNOSIS — E860 Dehydration: Secondary | ICD-10-CM | POA: Diagnosis not present

## 2021-06-04 DIAGNOSIS — E871 Hypo-osmolality and hyponatremia: Secondary | ICD-10-CM | POA: Diagnosis present

## 2021-06-04 DIAGNOSIS — Z791 Long term (current) use of non-steroidal anti-inflammatories (NSAID): Secondary | ICD-10-CM | POA: Diagnosis not present

## 2021-06-04 DIAGNOSIS — Z5181 Encounter for therapeutic drug level monitoring: Secondary | ICD-10-CM

## 2021-06-04 DIAGNOSIS — R599 Enlarged lymph nodes, unspecified: Secondary | ICD-10-CM

## 2021-06-04 LAB — CBC WITH DIFFERENTIAL/PLATELET
Abs Immature Granulocytes: 0.06 10*3/uL (ref 0.00–0.07)
Basophils Absolute: 0 10*3/uL (ref 0.0–0.1)
Basophils Relative: 0 %
Eosinophils Absolute: 0.3 10*3/uL (ref 0.0–0.5)
Eosinophils Relative: 2 %
HCT: 43.4 % (ref 39.0–52.0)
Hemoglobin: 13.7 g/dL (ref 13.0–17.0)
Immature Granulocytes: 1 %
Lymphocytes Relative: 18 %
Lymphs Abs: 2.2 10*3/uL (ref 0.7–4.0)
MCH: 25.8 pg — ABNORMAL LOW (ref 26.0–34.0)
MCHC: 31.6 g/dL (ref 30.0–36.0)
MCV: 81.9 fL (ref 80.0–100.0)
Monocytes Absolute: 0.7 10*3/uL (ref 0.1–1.0)
Monocytes Relative: 6 %
Neutro Abs: 8.9 10*3/uL — ABNORMAL HIGH (ref 1.7–7.7)
Neutrophils Relative %: 73 %
Platelets: 369 10*3/uL (ref 150–400)
RBC: 5.3 MIL/uL (ref 4.22–5.81)
RDW: 12.9 % (ref 11.5–15.5)
WBC: 12.1 10*3/uL — ABNORMAL HIGH (ref 4.0–10.5)
nRBC: 0 % (ref 0.0–0.2)

## 2021-06-04 LAB — BASIC METABOLIC PANEL
Anion gap: 6 (ref 5–15)
BUN: 11 mg/dL (ref 6–20)
CO2: 24 mmol/L (ref 22–32)
Calcium: 8.7 mg/dL — ABNORMAL LOW (ref 8.9–10.3)
Chloride: 102 mmol/L (ref 98–111)
Creatinine, Ser: 1.25 mg/dL — ABNORMAL HIGH (ref 0.61–1.24)
GFR, Estimated: >60 mL/min (ref >60)
Glucose, Bld: 158 mg/dL — ABNORMAL HIGH (ref 70–99)
Potassium: 4 mmol/L (ref 3.5–5.1)
Sodium: 132 mmol/L — ABNORMAL LOW (ref 135–145)

## 2021-06-04 NOTE — ED Provider Notes (Signed)
Emergency Medicine Provider Triage Evaluation Note  Victor Richardson , a 35 y.o. male  was evaluated in triage.  Pt complains of left armpit swelling/pain ongoing since early December.  Patient has been evaluated multiple times and been treated with antibiotics.  He reports his swelling and pain is increased since.  He has not been able to work.  Has associated increased warmth to the area. Denies drainage, wound, fever, chills, color change.  Denies history of diabetes.  Review of Systems  Positive: Abscess noted to left axilla and left upper arm Negative: Color change, wound, fever, chills  Physical Exam  BP 126/79 (BP Location: Right Arm)    Pulse 85    Temp 98.9 F (37.2 C) (Oral)    Resp 14    Ht 5\' 3"  (1.6 m)    Wt 65 kg    SpO2 98%    BMI 25.38 kg/m  Gen:   Awake, no distress   Resp:  Normal effort  MSK:   Moves extremities without difficulty  Other:  Fluctuance noted to area to left axilla/upper arm. Mild TTP to area. No surrounding erythema.   Medical Decision Making  Medically screening exam initiated at 6:50 PM.  Appropriate orders placed.  Victor Richardson was informed that the remainder of the evaluation will be completed by another provider, this initial triage assessment does not replace that evaluation, and the importance of remaining in the ED until their evaluation is complete.   Victor Richardson A, PA-C 06/04/21 1900    06/06/21, MD 06/04/21 2234

## 2021-06-04 NOTE — Progress Notes (Signed)
There are no problems to display for this patient.  Current Outpatient Medications on File Prior to Visit  Medication Sig Dispense Refill   acetaminophen (TYLENOL) 325 MG tablet Take 650 mg by mouth every 6 (six) hours as needed.     cephALEXin (KEFLEX) 500 MG capsule Take 1 capsule (500 mg total) by mouth 3 (three) times daily for 14 days. 42 capsule 0   ibuprofen (ADVIL) 600 MG tablet Take 1 tablet (600 mg total) by mouth every 6 (six) hours as needed for moderate pain. 30 tablet 0   naproxen sodium (ALEVE) 220 MG tablet Take 220 mg by mouth.     sulfamethoxazole-trimethoprim (BACTRIM DS) 800-160 MG tablet Take 1 tablet by mouth 2 (two) times daily for 14 days. 28 tablet 0   No current facility-administered medications on file prior to visit.   Subjective: [name] who is referred for evaluation of left arm and left axillary swelling.   He was accidentally stabbed by a screw driver while cutting tree beginning of December in his left palm. He did not care that area and noticed that it started having callus.in a  week. He tried to remove the callus with a needle and later noticed swelling in his left arm and left axilla. He started having pain at the swelling in the left arm and left axilla in the swelling.  Also has fevers and chills. Was seen in the UC 12/18 and was advised to go to the ED. He tells me he went to the ED, was febrile , WBC 13.2. Abscess was felt to be less likely. He was discharged on Augmentin for 7 days which he took as prescribed. However, the swelling and pain in the left arm and left axilla continued to progress and he is not able to work and it id difficult for him to lift his left arm. He was then seen in the UC on 12/26 and was given a course of cephalexin and bactrim which he has been taking as instructed without any improvement.   He moved to [place] in 2006 and lives with his mother and sister currently.  Denies smoking, alcohol and IVDU. Denies  h/o being homeless, incarceration and exposure to health care settings. Denies h/o TB or contact with TB.   Denies any other medical or surgical history. Has seasonal allergies.  Subjective fevers, denies chills and sweats.  Denies nausea, vomiting, abdominal pain and diarrhea Denies cough, chest pain and SOB Denies GU symptoms. Denies being sexually active Denies pets at home Denies headache, blurry vision  Review of Systems: ROS 10 point ROS done with pertinent positives and negatives as above   Past Medical History:  Diagnosis Date   Phimosis    Past Surgical History:  Procedure Laterality Date   CIRCUMCISION N/A 01/06/2015   Procedure: CIRCUMCISION ADULT;  Surgeon: Ihor Gully, MD;  Location: [facility];  Service: Urology;  Laterality: N/A;   NO PAST SURGERIES      Social History   Tobacco Use   Smoking status: Never   Smokeless tobacco: Never  Vaping Use   Vaping Use: Never used  Substance Use Topics   Alcohol use: No   Drug use: No    Family History  Problem Relation Age of Onset   Healthy Mother     No Known Allergies  Health Maintenance  Topic Date Due   COVID-19 Vaccine (1) Never done   HIV  Screening  Never done   Hepatitis C Screening  Never done   INFLUENZA VACCINE  Never done   TETANUS/TDAP  05/25/2031   Pneumococcal Vaccine 48-37 Years old  Aged Out   HPV VACCINES  Aged Out    Objective: BP 111/73    Pulse 77    Temp (!) 97.3 F (36.3 C) (Oral)    Wt 143 lb 6.4 oz (65 kg)    BMI 25.40 kg/m    Physical Exam Constitutional:      Appearance: Normal appearance.  HENT:     Head: Normocephalic and atraumatic.      Mouth: Mucous membranes are moist.  Eyes:    Conjunctiva/sclera: Conjunctivae normal.     Pupils: Pupils are equal, round, and reactive to light.   Cardiovascular:     Rate and Rhythm: Normal rate and regular rhythm.     Heart sounds: No murmur heard.   Pulmonary:     Effort: Pulmonary effort is normal.      Breath sounds: Normal breath sounds.   Abdominal:     General: Abdomen is flat.     Palpations: Abdomen is soft.   Musculoskeletal:        General:  2 swellings ( one in the left axilla and another just above the left elbow- warm to touch). ROM of left shoulder is restricted due to pain. Able to squeeze by bilateral hands WNL       Skin:    General: Skin is warm and dry.     Comments:  Neurological:     General: No focal deficit present.     Mental Status: awake, alert and oriented to person, place, and time.   Psychiatric:        Mood and Affect: Mood normal.   Lab Results Lab Results  Component Value Date   WBC 8.5 06/01/2021   HGB 14.7 06/01/2021   HCT 46.9 06/01/2021   MCV 81.7 06/01/2021   PLT 316 06/01/2021    Lab Results  Component Value Date   CREATININE 1.11 06/01/2021   BUN 10 06/01/2021   NA 135 06/01/2021   K 4.5 06/01/2021   CL 101 06/01/2021   CO2 26 06/01/2021    Lab Results  Component Value Date   ALT 78 (H) 06/01/2021   AST 41 06/01/2021   ALKPHOS 90 06/01/2021   BILITOT 0.9 06/01/2021    No results found for: CHOL, HDL, LDLCALC, LDLDIRECT, TRIG, CHOLHDL No results found for: LABRPR, RPRTITER No results found for: HIV1RNAQUANT, HIV1RNAVL, CD4TABS   Problem List Items Addressed This Visit       Immune and Lymphatic   Swelling of lymph node     Other   Cellulitis of upper extremity - Primary   Relevant Orders   C-reactive protein   Sedimentation rate   HIV Antibody (routine testing w rflx)   Hepatitis C antibody   CBC   Comprehensive metabolic panel   Medication monitoring encounter    Assessment/Plan Left axillary swelling /left distal forearm swelling in the setting of traumatic injury in the left palm by a screw driver while cutting tree  Swellings seem to be painful and are warm to touch. Will need to r/o abscess with advanced imaging like CT left arm including axilla  There is a nodular lymphangitis spread of the  swellings from the palm which is a pattern that can be seen with Nocardia, Sporotrichosis, Mycobacterium marinum etc.   I discussed with him the above possibilities  and he is willing to go to the ED for advanced imaging as it would take at least few days to a week  to get a CT done outpatient and his swelling/pain seems to be getting worse on antibiotics.   Recommend to get CT left upper extremity and axilla for evaluation of the swellings and may need surgical intervention pending CT findings.  Patient sent to ED for further evaluation and work up   I have personally spent 80 minutes involved in face-to-face and non-face-to-face activities for this patient on the day of the visit. Professional time spent includes the following activities: Preparing to see the patient (review of tests), Obtaining and/or reviewing separately obtained history (admission/discharge record), Performing a medically appropriate examination and/or evaluation , Ordering medications/tests/procedures, referring and communicating with other health care professionals, Documenting clinical information in the EMR, Independently interpreting results (not separately reported), Communicating results to the patient/family/caregiver, Counseling and educating the patient/family/caregiver and Care coordination (not separately reported).   Wilber Oliphant, [place] [facility] 06/04/2021, 2:23 PM

## 2021-06-04 NOTE — ED Triage Notes (Signed)
Pt here POV with c/o of left arm pit and arm swelling/pain. Pt seen for same 12/26. Pt taking antibiotics but swelling and pain have increased. Warm to the touch. No fever. 10/10 pain. Denies drainage.
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
## 2021-06-05 ENCOUNTER — Encounter (HOSPITAL_COMMUNITY): Payer: Self-pay | Admitting: Family Medicine

## 2021-06-05 ENCOUNTER — Inpatient Hospital Stay (HOSPITAL_COMMUNITY): Payer: BC Managed Care – PPO | Admitting: Certified Registered Nurse Anesthetist

## 2021-06-05 ENCOUNTER — Encounter (HOSPITAL_COMMUNITY)
Admission: EM | Disposition: A | Discharge status: Home / Self Care | Payer: Self-pay | Source: Ambulatory Visit | Attending: Internal Medicine

## 2021-06-05 DIAGNOSIS — L02412 Cutaneous abscess of left axilla: Secondary | ICD-10-CM | POA: Diagnosis present

## 2021-06-05 DIAGNOSIS — L02414 Cutaneous abscess of left upper limb: Secondary | ICD-10-CM | POA: Diagnosis present

## 2021-06-05 DIAGNOSIS — L03114 Cellulitis of left upper limb: Secondary | ICD-10-CM | POA: Diagnosis present

## 2021-06-05 DIAGNOSIS — Z20822 Contact with and (suspected) exposure to covid-19: Secondary | ICD-10-CM | POA: Diagnosis present

## 2021-06-05 DIAGNOSIS — E860 Dehydration: Secondary | ICD-10-CM | POA: Diagnosis present

## 2021-06-05 DIAGNOSIS — L02419 Cutaneous abscess of limb, unspecified: Secondary | ICD-10-CM | POA: Diagnosis present

## 2021-06-05 DIAGNOSIS — E871 Hypo-osmolality and hyponatremia: Secondary | ICD-10-CM | POA: Diagnosis present

## 2021-06-05 DIAGNOSIS — Z791 Long term (current) use of non-steroidal anti-inflammatories (NSAID): Secondary | ICD-10-CM | POA: Diagnosis not present

## 2021-06-05 HISTORY — PX: I & D EXTREMITY: SHX5045

## 2021-06-05 HISTORY — PX: IRRIGATION AND DEBRIDEMENT ABSCESS: SHX5252

## 2021-06-05 LAB — BASIC METABOLIC PANEL
Anion gap: 7 (ref 5–15)
BUN: 10 mg/dL (ref 6–20)
CO2: 22 mmol/L (ref 22–32)
Calcium: 8.5 mg/dL — ABNORMAL LOW (ref 8.9–10.3)
Chloride: 101 mmol/L (ref 98–111)
Creatinine, Ser: 1.13 mg/dL (ref 0.61–1.24)
GFR, Estimated: >60 mL/min (ref >60)
Glucose, Bld: 110 mg/dL — ABNORMAL HIGH (ref 70–99)
Potassium: 3.7 mmol/L (ref 3.5–5.1)
Sodium: 130 mmol/L — ABNORMAL LOW (ref 135–145)

## 2021-06-05 LAB — CBC
HCT: 41.6 % (ref 39.0–52.0)
Hemoglobin: 13.2 g/dL (ref 13.0–17.0)
MCH: 25.8 pg — ABNORMAL LOW (ref 26.0–34.0)
MCHC: 31.7 g/dL (ref 30.0–36.0)
MCV: 81.4 fL (ref 80.0–100.0)
Platelets: 329 10*3/uL (ref 150–400)
RBC: 5.11 MIL/uL (ref 4.22–5.81)
RDW: 13.1 % (ref 11.5–15.5)
WBC: 11 10*3/uL — ABNORMAL HIGH (ref 4.0–10.5)
nRBC: 0 % (ref 0.0–0.2)

## 2021-06-05 LAB — RESP PANEL BY RT-PCR (FLU A&B, COVID) ARPGX2
Influenza A by PCR: NEGATIVE
Influenza B by PCR: NEGATIVE
SARS Coronavirus 2 by RT PCR: NEGATIVE

## 2021-06-05 SURGERY — IRRIGATION AND DEBRIDEMENT EXTREMITY
Anesthesia: General | Site: Axilla | Laterality: Left

## 2021-06-05 MED ORDER — PROPOFOL 10 MG/ML IV BOLUS
INTRAVENOUS | Status: AC
  Filled 2021-06-05: qty 20

## 2021-06-05 MED ORDER — ACETAMINOPHEN 500 MG PO TABS
ORAL_TABLET | ORAL | Status: AC
  Administered 2021-06-05: 08:00:00 1000 mg via ORAL
  Filled 2021-06-05: qty 2

## 2021-06-05 MED ORDER — METOCLOPRAMIDE HCL 5 MG/ML IJ SOLN: 5.0000 mg | Freq: Three times a day (TID) | INTRAMUSCULAR | Status: DC | PRN

## 2021-06-05 MED ORDER — ONDANSETRON HCL 4 MG PO TABS: 4.0000 mg | ORAL_TABLET | Freq: Four times a day (QID) | ORAL | Status: DC | PRN

## 2021-06-05 MED ORDER — MIDAZOLAM HCL 2 MG/2ML IJ SOLN
INTRAMUSCULAR | Status: AC
  Filled 2021-06-05: qty 2

## 2021-06-05 MED ORDER — FENTANYL CITRATE (PF) 100 MCG/2ML IJ SOLN
25.0000 ug | INTRAMUSCULAR | Status: DC | PRN
  Administered 2021-06-05: 10:00:00 50 ug via INTRAVENOUS

## 2021-06-05 MED ORDER — OXYCODONE HCL 5 MG PO TABS: 5.0000 mg | ORAL_TABLET | ORAL | Status: DC | PRN

## 2021-06-05 MED ORDER — OXYCODONE HCL 5 MG PO TABS
5.0000 mg | ORAL_TABLET | ORAL | Status: DC | PRN
  Administered 2021-06-05 – 2021-06-08 (×3): 10 mg via ORAL
  Filled 2021-06-05 (×2): qty 2

## 2021-06-05 MED ORDER — OXYCODONE HCL 5 MG PO TABS
10.0000 mg | ORAL_TABLET | ORAL | Status: DC | PRN
  Administered 2021-06-05 – 2021-06-08 (×4): 15 mg via ORAL
  Filled 2021-06-05 (×5): qty 3

## 2021-06-05 MED ORDER — POVIDONE-IODINE 10 % EX SWAB: 2.0000 "application " | Freq: Once | CUTANEOUS | Status: DC

## 2021-06-05 MED ORDER — ORAL CARE MOUTH RINSE: 15.0000 mL | Freq: Once | OROMUCOSAL | Status: AC

## 2021-06-05 MED ORDER — HYDROMORPHONE HCL 1 MG/ML IJ SOLN
0.5000 mg | INTRAMUSCULAR | Status: DC | PRN
  Administered 2021-06-06: 1 mg via INTRAVENOUS
  Filled 2021-06-05: qty 1

## 2021-06-05 MED ORDER — CHLORHEXIDINE GLUCONATE 0.12 % MT SOLN
OROMUCOSAL | Status: AC
  Administered 2021-06-05: 08:00:00 15 mL via OROMUCOSAL
  Filled 2021-06-05: qty 15

## 2021-06-05 MED ORDER — FENTANYL CITRATE (PF) 250 MCG/5ML IJ SOLN
INTRAMUSCULAR | Status: AC
  Filled 2021-06-05: qty 5

## 2021-06-05 MED ORDER — FENTANYL CITRATE (PF) 100 MCG/2ML IJ SOLN
INTRAMUSCULAR | Status: AC
  Filled 2021-06-05: qty 2

## 2021-06-05 MED ORDER — ACETAMINOPHEN 500 MG PO TABS: 1000.0000 mg | ORAL_TABLET | Freq: Once | ORAL | Status: AC

## 2021-06-05 MED ORDER — CHLORHEXIDINE GLUCONATE 4 % EX LIQD: 60.0000 mL | Freq: Once | CUTANEOUS | Status: DC

## 2021-06-05 MED ORDER — METOCLOPRAMIDE HCL 5 MG PO TABS: 5.0000 mg | ORAL_TABLET | Freq: Three times a day (TID) | ORAL | Status: DC | PRN

## 2021-06-05 MED ORDER — SODIUM CHLORIDE 0.9 % IV SOLN
2000.0000 mg | Freq: Once | INTRAVENOUS | Status: AC
  Administered 2021-06-05: 05:00:00 2000 mg via INTRAVENOUS
  Filled 2021-06-05: qty 2

## 2021-06-05 MED ORDER — CELECOXIB 200 MG PO CAPS
ORAL_CAPSULE | ORAL | Status: AC
  Administered 2021-06-05: 08:00:00 200 mg via ORAL
  Filled 2021-06-05: qty 1

## 2021-06-05 MED ORDER — DEXAMETHASONE SODIUM PHOSPHATE 10 MG/ML IJ SOLN
INTRAMUSCULAR | Status: DC | PRN
  Administered 2021-06-05: 5 mg via INTRAVENOUS

## 2021-06-05 MED ORDER — CEFAZOLIN SODIUM-DEXTROSE 2-4 GM/100ML-% IV SOLN
INTRAVENOUS | Status: AC
  Filled 2021-06-05: qty 100

## 2021-06-05 MED ORDER — ONDANSETRON HCL 4 MG/2ML IJ SOLN: 4.0000 mg | Freq: Four times a day (QID) | INTRAMUSCULAR | Status: DC | PRN

## 2021-06-05 MED ORDER — METHOCARBAMOL 500 MG PO TABS
ORAL_TABLET | ORAL | Status: AC
  Filled 2021-06-05: qty 1

## 2021-06-05 MED ORDER — CELECOXIB 200 MG PO CAPS: 200.0000 mg | ORAL_CAPSULE | Freq: Once | ORAL | Status: AC

## 2021-06-05 MED ORDER — PROPOFOL 10 MG/ML IV BOLUS
INTRAVENOUS | Status: DC | PRN
  Administered 2021-06-05: 200 mg via INTRAVENOUS

## 2021-06-05 MED ORDER — VANCOMYCIN HCL 1250 MG/250ML IV SOLN
1250.0000 mg | Freq: Once | INTRAVENOUS | Status: AC
  Administered 2021-06-05: 02:00:00 1250 mg via INTRAVENOUS
  Filled 2021-06-05: qty 250

## 2021-06-05 MED ORDER — MORPHINE SULFATE (PF) 4 MG/ML IV SOLN: 4.0000 mg | INTRAVENOUS | Status: DC | PRN

## 2021-06-05 MED ORDER — SENNOSIDES-DOCUSATE SODIUM 8.6-50 MG PO TABS: 1.0000 | ORAL_TABLET | Freq: Every evening | ORAL | Status: DC | PRN

## 2021-06-05 MED ORDER — DOCUSATE SODIUM 100 MG PO CAPS
100.0000 mg | ORAL_CAPSULE | Freq: Two times a day (BID) | ORAL | Status: DC
  Administered 2021-06-05 – 2021-06-08 (×6): 100 mg via ORAL
  Filled 2021-06-05 (×6): qty 1

## 2021-06-05 MED ORDER — OXYCODONE HCL 5 MG PO TABS: 5.0000 mg | ORAL_TABLET | Freq: Once | ORAL | Status: DC | PRN

## 2021-06-05 MED ORDER — VANCOMYCIN HCL 1250 MG/250ML IV SOLN
1250.0000 mg | INTRAVENOUS | Status: DC
  Administered 2021-06-05: 23:00:00 1250 mg via INTRAVENOUS
  Filled 2021-06-05: qty 250

## 2021-06-05 MED ORDER — ACETAMINOPHEN 325 MG PO TABS: 650.0000 mg | ORAL_TABLET | Freq: Four times a day (QID) | ORAL | Status: DC | PRN

## 2021-06-05 MED ORDER — ACETAMINOPHEN 325 MG PO TABS: 325.0000 mg | ORAL_TABLET | Freq: Four times a day (QID) | ORAL | Status: DC | PRN

## 2021-06-05 MED ORDER — PROMETHAZINE HCL 25 MG/ML IJ SOLN
6.2500 mg | INTRAMUSCULAR | Status: DC | PRN
Start: 2021-06-05 — End: 2021-06-05

## 2021-06-05 MED ORDER — METHOCARBAMOL 1000 MG/10ML IJ SOLN
500.0000 mg | Freq: Four times a day (QID) | INTRAVENOUS | Status: DC | PRN
  Filled 2021-06-05: qty 5

## 2021-06-05 MED ORDER — FENTANYL CITRATE (PF) 250 MCG/5ML IJ SOLN
INTRAMUSCULAR | Status: DC | PRN
  Administered 2021-06-05 (×2): 50 ug via INTRAVENOUS
  Administered 2021-06-05: 100 ug via INTRAVENOUS
  Administered 2021-06-05: 50 ug via INTRAVENOUS

## 2021-06-05 MED ORDER — IOHEXOL 350 MG/ML SOLN
80.0000 mL | Freq: Once | INTRAVENOUS | Status: AC | PRN
  Administered 2021-06-04: 80 mL via INTRAVENOUS

## 2021-06-05 MED ORDER — CEFAZOLIN SODIUM-DEXTROSE 2-4 GM/100ML-% IV SOLN
2.0000 g | INTRAVENOUS | Status: AC
  Administered 2021-06-05: 09:00:00 2 g via INTRAVENOUS

## 2021-06-05 MED ORDER — OXYCODONE HCL 5 MG/5ML PO SOLN: 5.0000 mg | Freq: Once | ORAL | Status: DC | PRN

## 2021-06-05 MED ORDER — BISACODYL 10 MG RE SUPP: 10.0000 mg | Freq: Every day | RECTAL | Status: DC | PRN

## 2021-06-05 MED ORDER — ONDANSETRON HCL 4 MG/2ML IJ SOLN
INTRAMUSCULAR | Status: DC | PRN
  Administered 2021-06-05: 4 mg via INTRAVENOUS

## 2021-06-05 MED ORDER — SODIUM CHLORIDE 0.9 % IV SOLN: INTRAVENOUS | Status: DC

## 2021-06-05 MED ORDER — SODIUM CHLORIDE 0.9 % IV SOLN
2.0000 g | Freq: Three times a day (TID) | INTRAVENOUS | Status: DC
  Administered 2021-06-05 – 2021-06-08 (×8): 2 g via INTRAVENOUS
  Filled 2021-06-05 (×7): qty 2

## 2021-06-05 MED ORDER — METHOCARBAMOL 500 MG PO TABS
500.0000 mg | ORAL_TABLET | Freq: Four times a day (QID) | ORAL | Status: DC | PRN
  Administered 2021-06-05 – 2021-06-07 (×3): 500 mg via ORAL
  Filled 2021-06-05 (×2): qty 1

## 2021-06-05 MED ORDER — CHLORHEXIDINE GLUCONATE 0.12 % MT SOLN: 15.0000 mL | Freq: Once | OROMUCOSAL | Status: AC

## 2021-06-05 MED ORDER — LACTATED RINGERS IV SOLN: INTRAVENOUS | Status: AC

## 2021-06-05 MED ORDER — ACETAMINOPHEN 650 MG RE SUPP: 650.0000 mg | Freq: Four times a day (QID) | RECTAL | Status: DC | PRN

## 2021-06-05 MED ORDER — 0.9 % SODIUM CHLORIDE (POUR BTL) OPTIME
TOPICAL | Status: DC | PRN
  Administered 2021-06-05: 09:00:00 1000 mL

## 2021-06-05 MED ORDER — LIDOCAINE 2% (20 MG/ML) 5 ML SYRINGE
INTRAMUSCULAR | Status: DC | PRN
  Administered 2021-06-05: 60 mg via INTRAVENOUS

## 2021-06-05 MED ORDER — POLYETHYLENE GLYCOL 3350 17 G PO PACK: 17.0000 g | PACK | Freq: Every day | ORAL | Status: DC | PRN

## 2021-06-05 MED ORDER — LACTATED RINGERS IV SOLN: INTRAVENOUS | Status: DC

## 2021-06-05 MED ORDER — MIDAZOLAM HCL 2 MG/2ML IJ SOLN
INTRAMUSCULAR | Status: DC | PRN
  Administered 2021-06-05: 2 mg via INTRAVENOUS

## 2021-06-05 MED ORDER — OXYCODONE HCL 5 MG PO TABS
ORAL_TABLET | ORAL | Status: AC
  Filled 2021-06-05: qty 2

## 2021-06-05 SURGICAL SUPPLY — 34 items
BAG COUNTER SPONGE SURGICOUNT (BAG) IMPLANT
BLADE SURG 21 STRL SS (BLADE) ×3 IMPLANT
BNDG COHESIVE 6X5 TAN NS LF (GAUZE/BANDAGES/DRESSINGS) ×1 IMPLANT
BNDG COHESIVE 6X5 TAN STRL LF (GAUZE/BANDAGES/DRESSINGS) IMPLANT
BNDG GAUZE ELAST 4 BULKY (GAUZE/BANDAGES/DRESSINGS) ×5 IMPLANT
COVER SURGICAL LIGHT HANDLE (MISCELLANEOUS) ×6 IMPLANT
DRAPE U-SHAPE 47X51 STRL (DRAPES) ×3 IMPLANT
DRSG ADAPTIC 3X8 NADH LF (GAUZE/BANDAGES/DRESSINGS) ×3 IMPLANT
DRSG PAD ABDOMINAL 8X10 ST (GAUZE/BANDAGES/DRESSINGS) ×1 IMPLANT
DURAPREP 26ML APPLICATOR (WOUND CARE) ×3 IMPLANT
ELECT REM PT RETURN 9FT ADLT (ELECTROSURGICAL)
ELECTRODE REM PT RTRN 9FT ADLT (ELECTROSURGICAL) IMPLANT
GAUZE SPONGE 4X4 12PLY STRL (GAUZE/BANDAGES/DRESSINGS) ×3 IMPLANT
GAUZE SPONGE 4X4 12PLY STRL LF (GAUZE/BANDAGES/DRESSINGS) ×1 IMPLANT
GLOVE SURG ORTHO LTX SZ9 (GLOVE) ×3 IMPLANT
GLOVE SURG UNDER POLY LF SZ9 (GLOVE) ×3 IMPLANT
GOWN STRL REUS W/ TWL XL LVL3 (GOWN DISPOSABLE) ×4 IMPLANT
GOWN STRL REUS W/TWL XL LVL3 (GOWN DISPOSABLE) ×2
HANDPIECE INTERPULSE COAX TIP (DISPOSABLE)
KIT BASIN OR (CUSTOM PROCEDURE TRAY) ×3 IMPLANT
KIT TURNOVER KIT B (KITS) ×3 IMPLANT
MANIFOLD NEPTUNE II (INSTRUMENTS) ×3 IMPLANT
NS IRRIG 1000ML POUR BTL (IV SOLUTION) ×3 IMPLANT
PACK ORTHO EXTREMITY (CUSTOM PROCEDURE TRAY) ×3 IMPLANT
PAD ARMBOARD 7.5X6 YLW CONV (MISCELLANEOUS) ×6 IMPLANT
SET HNDPC FAN SPRY TIP SCT (DISPOSABLE) IMPLANT
STOCKINETTE IMPERVIOUS 9X36 MD (GAUZE/BANDAGES/DRESSINGS) IMPLANT
SUT ETHILON 2 0 PSLX (SUTURE) ×3 IMPLANT
SWAB COLLECTION DEVICE MRSA (MISCELLANEOUS) ×3 IMPLANT
SWAB CULTURE ESWAB REG 1ML (MISCELLANEOUS) IMPLANT
TAPE CLOTH 4X10 WHT NS (GAUZE/BANDAGES/DRESSINGS) ×1 IMPLANT
TOWEL GREEN STERILE (TOWEL DISPOSABLE) ×3 IMPLANT
TUBE CONNECTING 12X1/4 (SUCTIONS) ×3 IMPLANT
YANKAUER SUCT BULB TIP NO VENT (SUCTIONS) ×3 IMPLANT
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
## 2021-06-05 NOTE — Op Note (Signed)
06/04/2021 - 06/05/2021  9:15 AM  PATIENT:  Victor Richardson    PRE-OPERATIVE DIAGNOSIS:  Abscess Left Elbow  POST-OPERATIVE DIAGNOSIS:  Same  PROCEDURE:  IRRIGATION AND DEBRIDEMENT ELBOW  SURGEON:  Nadara Mustard, MD  PHYSICIAN ASSISTANT:None ANESTHESIA:   General  PREOPERATIVE INDICATIONS:  [name] is a  35 y.o. male with a diagnosis of Abscess Left Elbow who failed conservative measures and elected for surgical management.    The risks benefits and alternatives were discussed with the patient preoperatively including but not limited to the risks of infection, bleeding, nerve injury, cardiopulmonary complications, the need for revision surgery, among others, and the patient was willing to proceed.  OPERATIVE IMPLANTS: Half-inch Penrose drain  @ENCIMAGES @  OPERATIVE FINDINGS: Patient had a large abscess lateral antecubital region no defined antecubital nodes.  OPERATIVE PROCEDURE: Patient was brought the operating room and underwent a general anesthetic.  After adequate levels anesthesia were obtained patient's left upper extremity was prepped the axilla was prepped with alcohol and peroxide and then the left upper extremity was prepped using DuraPrep draped into a sterile field a timeout was called.  A longitudinal incision was made anterior lateral antecubital region directly over the abscess.  Dissection was carried down through the skin scissors were used for blunt dissection there was a large noncontained abscess.  This fluid was sent for cultures.  The area was further debrided there was no loculation around the abscess.  The muscle and soft tissue was healthy.  The superficial lateral nerves were intact.  The wound was irrigated with normal saline the incision was closed using 2-0 nylon over half inch Penrose drain.  Case was turned over to Dr. who proceeded with axillary debridement for the axillary abscess.  Debridement type: Excisional Debridement  Side: left  Body  Location: arm   Tools used for debridement: scalpel and scissors  Pre-debridement Wound size (cm):   Length: 0        Width: 0     Depth: 0   Post-debridement Wound size (cm):   Length: 7        Width: 5     Depth: 5   Debridement depth beyond dead/damaged tissue down to healthy viable tissue: yes  Tissue layer involved: skin, subcutaneous tissue, muscle / fascia  Nature of tissue removed: Purulence  Irrigation volume: 1 liter     Irrigation fluid type: Normal Saline     DISCHARGE PLANNING:  Antibiotic duration: Continue antibiotics based on the culture findings.  Weightbearing: Not applicable  Pain medication: Postoperative protocol opioid  Dressing care/ Wound VAC: Reinforce dressing as needed.  We will remove Penrose drain in a week.  Ambulatory devices: Not applicable  Discharge to: Anticipate discharge to home on oral antibiotics based on cultures  Follow-up: In the office 1 week post operative.

## 2021-06-05 NOTE — ED Notes (Signed)
Surgical consent signed and placed on back of stretcher w/ pt

## 2021-06-05 NOTE — Anesthesia Procedure Notes (Signed)
Procedure Name: LMA Insertion Date/Time: 06/05/2021 8:39 AM Performed by: Drema Pry, CRNA Pre-anesthesia Checklist: Patient identified, Emergency Drugs available, Suction available and Patient being monitored Patient Re-evaluated:Patient Re-evaluated prior to induction Oxygen Delivery Method: Circle System Utilized Preoxygenation: Pre-oxygenation with 100% oxygen Induction Type: IV induction Ventilation: Mask ventilation without difficulty LMA: LMA inserted LMA Size: 4.0 Number of attempts: 1 Placement Confirmation: positive ETCO2 Tube secured with: Tape Dental Injury: Teeth and Oropharynx as per pre-operative assessment

## 2021-06-05 NOTE — Anesthesia Preprocedure Evaluation (Addendum)
Anesthesia Evaluation  Patient identified by MRN, date of birth, ID band Patient awake    Reviewed: Allergy & Precautions, NPO status , Patient's Chart, lab work & pertinent test results  History of Anesthesia Complications Negative for: history of anesthetic complications  Airway Mallampati: I  TM Distance: >3 FB Neck ROM: Full    Dental  (+) Dental Advisory Given, Teeth Intact   Pulmonary neg pulmonary ROS,    Pulmonary exam normal        Cardiovascular negative cardio ROS Normal cardiovascular exam     Neuro/Psych negative neurological ROS  negative psych ROS   GI/Hepatic negative GI ROS, Neg liver ROS,   Endo/Other   Na 130   Renal/GU negative Renal ROS     Musculoskeletal negative musculoskeletal ROS (+)   Abdominal   Peds  Hematology negative hematology ROS (+)   Anesthesia Other Findings   Reproductive/Obstetrics                            Anesthesia Physical Anesthesia Plan  ASA: 1  Anesthesia Plan: General   Post-op Pain Management: Tylenol PO (pre-op) and Celebrex PO (pre-op)   Induction: Intravenous  PONV Risk Score and Plan: 2 and Treatment may vary due to age or medical condition, Ondansetron, Dexamethasone and Midazolam  Airway Management Planned: LMA  Additional Equipment: None  Intra-op Plan:   Post-operative Plan: Extubation in OR  Informed Consent: I have reviewed the patients History and Physical, chart, labs and discussed the procedure including the risks, benefits and alternatives for the proposed anesthesia with the patient or authorized representative who has indicated his/her understanding and acceptance.     Dental advisory given  Plan Discussed with: CRNA and Anesthesiologist  Anesthesia Plan Comments:       Anesthesia Quick Evaluation

## 2021-06-05 NOTE — Progress Notes (Signed)
Removed to room 16 as he is on hold pending room assignment/cleaning / sister in attendance/ call bell in place and he acknowledges understanding of how to get assitance if needed
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
## 2021-06-05 NOTE — Anesthesia Postprocedure Evaluation (Signed)
Anesthesia Post Note  Patient: Branon Valera  Procedure(s) Performed: IRRIGATION AND DEBRIDEMENT ELBOW (Left) IRRIGATION AND DEBRIDEMENT AXILLARY ABSCESS (Left: Axilla)     Patient location during evaluation: PACU Anesthesia Type: General Level of consciousness: awake and alert Pain management: pain level controlled Vital Signs Assessment: post-procedure vital signs reviewed and stable Respiratory status: spontaneous breathing, nonlabored ventilation and respiratory function stable Cardiovascular status: stable and blood pressure returned to baseline Anesthetic complications: no   No notable events documented.  Last Vitals:  Vitals:   06/05/21 0957 06/05/21 1013  BP: 127/78 116/75  Pulse: 78 72  Resp: 15 12  Temp:  36.6 C  SpO2: 98% 98%    Last Pain:  Vitals:   06/05/21 0747  TempSrc: Oral  PainSc:                  Beryle Lathe

## 2021-06-05 NOTE — Transfer of Care (Signed)
Immediate Anesthesia Transfer of Care Note  Patient: Victor Richardson  Procedure(s) Performed: IRRIGATION AND DEBRIDEMENT ELBOW (Left) IRRIGATION AND DEBRIDEMENT AXILLARY ABSCESS (Left: Axilla)  Patient Location: PACU  Anesthesia Type:General  Level of Consciousness: drowsy, patient cooperative and responds to stimulation  Airway & Oxygen Therapy: Patient Spontanous Breathing  Post-op Assessment: Report given to RN and Post -op Vital signs reviewed and stable  Post vital signs: Reviewed and stable  Last Vitals:  Vitals Value Taken Time  BP 110/78 06/05/21 0942  Temp    Pulse 76 06/05/21 0944  Resp 13 06/05/21 0944  SpO2 98 % 06/05/21 0944  Vitals shown include unvalidated device data.  Last Pain:  Vitals:   06/05/21 0747  TempSrc: Oral  PainSc:          Complications: No notable events documented.

## 2021-06-05 NOTE — Progress Notes (Addendum)
Pharmacy Antibiotic Note  Orland Credit is a 35 y.o. male admitted on 06/04/2021 with  wound infection/abscess .  Pharmacy has been consulted for Vancomycin/Cefepime dosing. WBC mildly elevated. Renal function ok.   Plan: Vancomycin 1250 mg IV q24h >>>Estimated AUC: 557 Cefepime 2g IV q8h Trend WBC, temp, renal function  F/U infectious work-up Drug levels as indicated   Height: 5\' 3"  (160 cm) Weight: 65 kg (143 lb 4.8 oz) IBW/kg (Calculated) : 56.9  Temp (24hrs), Avg:98.1 F (36.7 C), Min:97.3 F (36.3 C), Max:98.9 F (37.2 C)  Recent Labs  Lab 06/01/21 1026 06/04/21 1746  WBC 8.5 12.1*  CREATININE 1.11 1.25*    Estimated Creatinine Clearance: 67 mL/min (A) (by C-G formula based on SCr of 1.25 mg/dL (H)).    No Known Allergies  06/06/21, PharmD, BCPS Clinical Pharmacist Phone: [phone]
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
## 2021-06-05 NOTE — H&P (Signed)
History and Physical    Willet Schleifer QBH:[id number] DOB: 10/22/1985 DOA: 06/04/2021  PCP: Pcp, No   Patient coming from: Home   Chief Complaint: Left arm and armpit pain and swelling   HPI: Victor Richardson is a pleasant 35 y.o. male who denies any significant past medical history, now presents emergency department with worsening pain and swelling involving the medial left arm and left axilla.  Patient reports suffering a puncture wound from a screwdriver the first week of December, had a small amount of purulent drainage from the site which then appeared to heal, but he went on to develop pain and swelling medial left arm and in the left axilla.  He was seen in the emergency department for this on 05/24/2021, had Tdap updated, and was discharged home with Augmentin.  Symptoms persisted despite completing the course of antibiotics and he went to urgent care on 06/01/2021 where he was started on Bactrim and Keflex.  He then followed up in the ID clinic yesterday, and from there was directed to the ED for CT.  He has had some subjective fevers, no drainage, and no other rash, wound, or red/hot/swollen joint.  He denies IV drug use or diabetes history.  ED Course: Upon arrival to the ED, patient is found to be afebrile, saturating well on room air, and with stable blood pressure.  Chemistry panel notable for sodium 132 and creatinine 1.25.  CBC with leukocytosis 12,100.  CT of the left arm reveals abscesses in the left axilla and medial left arm.  Blood cultures were collected, surgery consulted by ED physician, and empiric vancomycin and cefepime started.  Review of Systems:  All other systems reviewed and apart from HPI, are negative.  Past Medical History:  Diagnosis Date   Phimosis     Past Surgical History:  Procedure Laterality Date   CIRCUMCISION N/A 01/06/2015   Procedure: CIRCUMCISION ADULT;  Surgeon: Ihor Gully, MD;  Location: [facility];  Service: Urology;  Laterality: N/A;    NO PAST SURGERIES      Social History:   reports that he has never smoked. He has never used smokeless tobacco. He reports that he does not drink alcohol and does not use drugs.  No Known Allergies  Family History  Problem Relation Age of Onset   Healthy Mother      Prior to Admission medications   Medication Sig Start Date End Date Taking? Authorizing Provider  acetaminophen (TYLENOL) 325 MG tablet Take 650 mg by mouth every 6 (six) hours as needed for mild pain.   Yes [provider]  cephALEXin (KEFLEX) 500 MG capsule Take 1 capsule (500 mg total) by mouth 3 (three) times daily for 14 days. 06/01/21 06/15/21 Yes Raspet, Erin K, PA-C  ibuprofen (ADVIL) 600 MG tablet Take 1 tablet (600 mg total) by mouth every 6 (six) hours as needed for moderate pain. 05/24/21  Yes Fayrene Helper, PA-C  naproxen sodium (ALEVE) 220 MG tablet Take 220 mg by mouth.   Yes [provider]  sulfamethoxazole-trimethoprim (BACTRIM DS) 800-160 MG tablet Take 1 tablet by mouth 2 (two) times daily for 14 days. 06/01/21 06/15/21 Yes Raspet, Noberto Retort, PA-C    Physical Exam: Vitals:   06/04/21 1632 06/04/21 1638 06/04/21 2035 06/04/21 2330  BP: 126/79  (!) 105/56 105/71  Pulse: 85  77 77  Resp: 14  16 16   Temp: 98.9 F (37.2 C)     TempSrc: Oral     SpO2: 98%  98% 96%  Weight:  65 kg    Height:  5\' 3"  (1.6 m)      Constitutional: NAD, calm  Eyes: PERTLA, lids and conjunctivae normal ENMT: Mucous membranes are moist. Posterior pharynx clear of any exudate or lesions.   Neck: supple, no masses  Respiratory: no wheezing, no crackles. No accessory muscle use.  Cardiovascular: S1 & S2 heard, regular rate and rhythm. No extremity edema.   Abdomen: No distension, no tenderness, soft. Bowel sounds active.  Musculoskeletal: no clubbing / cyanosis. No joint deformity upper and lower extremities.   Skin: Tender fluctuant nodules at left axilla and medial left arm. Warm, dry, well-perfused. Neurologic:  CN 2-12 grossly intact. Moving all extremities. Alert and oriented.  Psychiatric: Pleasant. Cooperative.    Labs and Imaging on Admission: I have personally reviewed following labs and imaging studies  CBC: Recent Labs  Lab 06/01/21 1026 06/04/21 1746  WBC 8.5 12.1*  NEUTROABS 5.1 8.9*  HGB 14.7 13.7  HCT 46.9 43.4  MCV 81.7 81.9  PLT 316 369   Basic Metabolic Panel: Recent Labs  Lab 06/01/21 1026 06/04/21 1746  NA 135 132*  K 4.5 4.0  CL 101 102  CO2 26 24  GLUCOSE 99 158*  BUN 10 11  CREATININE 1.11 1.25*  CALCIUM 9.1 8.7*   GFR: Estimated Creatinine Clearance: 67 mL/min (A) (by C-G formula based on SCr of 1.25 mg/dL (H)). Liver Function Tests: Recent Labs  Lab 06/01/21 1026  AST 41  ALT 78*  ALKPHOS 90  BILITOT 0.9  PROT 8.4*  ALBUMIN 3.2*   No results for input(s): LIPASE, AMYLASE in the last 168 hours. No results for input(s): AMMONIA in the last 168 hours. Coagulation Profile: No results for input(s): INR, PROTIME in the last 168 hours. Cardiac Enzymes: No results for input(s): CKTOTAL, CKMB, CKMBINDEX, TROPONINI in the last 168 hours. BNP (last 3 results) No results for input(s): PROBNP in the last 8760 hours. HbA1C: No results for input(s): HGBA1C in the last 72 hours. CBG: No results for input(s): GLUCAP in the last 168 hours. Lipid Profile: No results for input(s): CHOL, HDL, LDLCALC, TRIG, CHOLHDL, LDLDIRECT in the last 72 hours. Thyroid Function Tests: No results for input(s): TSH, T4TOTAL, FREET4, T3FREE, THYROIDAB in the last 72 hours. Anemia Panel: No results for input(s): VITAMINB12, FOLATE, FERRITIN, TIBC, IRON, RETICCTPCT in the last 72 hours. Urine analysis:    Component Value Date/Time   LABSPEC >=1.030 12/10/2014 1531   PHURINE 6.0 12/10/2014 1531   GLUCOSEU NEGATIVE 12/10/2014 1531   HGBUR NEGATIVE 12/10/2014 1531   BILIRUBINUR NEGATIVE 12/10/2014 1531   KETONESUR NEGATIVE 12/10/2014 1531   PROTEINUR NEGATIVE 12/10/2014  1531   UROBILINOGEN 0.2 12/10/2014 1531   NITRITE NEGATIVE 12/10/2014 1531   LEUKOCYTESUR NEGATIVE 12/10/2014 1531   Sepsis Labs: @LABRCNTIP (procalcitonin:4,lacticidven:4) )No results found for this or any previous visit (from the past 240 hour(s)).   Radiological Exams on Admission: CT Extrem Up Entire Arm L W/CM  Result Date: 06/05/2021 CLINICAL DATA:  Soft tissue infection suspected. EXAM: CT OF THE UPPER LEFT EXTREMITY WITH CONTRAST TECHNIQUE: Multidetector CT imaging of the upper left extremity was performed according to the standard protocol following intravenous contrast administration. CONTRAST:  39mL OMNIPAQUE IOHEXOL 350 MG/ML SOLN COMPARISON:  None. FINDINGS: Bones/Joint/Cartilage No acute fracture or dislocation. The bones are well mineralized. No arthritic changes. No joint effusion. The visualized major arteries of the left upper extremity appear patent. Ligaments Suboptimally assessed by CT. Muscles and Tendons No intramuscular  hematoma fluid collection. Soft tissues There is a complex collection with enhancing walls in the subcutaneous soft tissues of the left axilla measuring 5.6 x 3.7 cm in greatest axial dimensions and 7.7 cm in craniocaudal length most consistent with an abscess. Additional complex collection with enhancing walls measure 3.3 x 2.8 x 4.2 cm in the subcutaneous and superficial soft tissues of the medial distal arm. IMPRESSION: 1. No acute fracture or dislocation. 2. Abscesses of the left axilla and left upper extremity. Electronically Signed   By: Elgie Collard M.D.   On: 06/05/2021 00:33     Assessment/Plan  1. Left arm and left axilla abscesses  - Pt with puncture wound to Lt palm 3-4 wks ago p/w progressive pain and swelling involving medial LUE and Lt axilla despite oral antibiotics  - Found to have abscesses on CT  - Not septic on admission (only 1 SIRS criterion and qSOFA score of zero)  - Appreciate surgical consultants, continue antibiotics, keep  NPO, follow cultures and clinical course     DVT prophylaxis:  SCDs  Code Status: Full  Level of Care: Level of care: Med-Surg Family Communication: Sister at bedside  Disposition Plan:  Patient is from: Home  Anticipated d/c is to: Home  Anticipated d/c date is: 1/1 or 06/08/21 Patient currently: Pending surgical consultation, likely I&D  Consults called: Orthopedic surgery  Admission status: Inpatient     Briscoe Deutscher, MD [facility]  06/05/2021, 4:05 AM

## 2021-06-05 NOTE — Progress Notes (Signed)
Patient ID: Victor Richardson, male   DOB: 02-03-1986, 35 y.o.   MRN: [id number] I did stop by to evaluate the patient early this morning.  He reports that he has had a mass around his left elbow for about a month and he has some pain associated with that but are is also significant pain in his left axilla.  He denies any drug use.  He states he had a penetrating trauma with tools to his left hand about a month ago.  A CT scan was obtained showing a fluid collection consistent with likely an abscess at the left medial elbow but also in the left axilla.  Orthopedic surgery is consulted to assess the left elbow.  I believe general surgery has been consulted due to the axillary abscess.  On exam of the left elbow.  There is a soft tissue mass on the medial aspect of his elbow.  There is no associated cellulitis at the elbow and it is only showing some minimal pain.  Palpation of the axilla causes quite significant pain.  I did speak with one of my partners about seeing the patient this morning and finding out how to coordinate a surgical intervention with general surgery who hopefully will address the left axillary abscess.  The patient should remain n.p.o. for now.

## 2021-06-05 NOTE — Interval H&P Note (Signed)
History and Physical Interval Note:  06/05/2021 7:54 AM I have seen and examined patient, has left axillary abscess and will do I and D in conjunction with left elbow abscess being taken care of by Dr Lajoyce Corners. Victor Richardson  has presented today for surgery, with the diagnosis of Abscess Left Elbow.  The various methods of treatment have been discussed with the patient and family. After consideration of risks, benefits and other options for treatment, the patient has consented to  Procedure(s): IRRIGATION AND DEBRIDEMENT ELBOW (Left) IRRIGATION AND DEBRIDEMENT AXILLARY ABSCESS (Left) as a surgical intervention.  The patient's history has been reviewed, patient examined, no change in status, stable for surgery.  I have reviewed the patient's chart and labs.  Questions were answered to the patient's satisfaction.     Victor Richardson

## 2021-06-05 NOTE — Progress Notes (Signed)
Patient admitted early morning hours by nighttime hospitalist with posttraumatic left elbow and left axillary abscess with spreading cellulitis.  Currently on broad-spectrum antibiotics.  Underwent incision and drainage by orthopedics in the elbow and general surgery in the axilla.  Blood cultures no growth on admission.  Patient was on Augmentin before coming to the hospital.  Operative cultures are collected.  Continue IV antibiotics.  Postoperative management, wound care as per surgery.  Anticipate broad-spectrum antibiotics next 24 to 48 hours and subsequent oral antibiotics.   No charge visit.  Same-day admission.

## 2021-06-06 ENCOUNTER — Encounter (HOSPITAL_COMMUNITY): Payer: Self-pay | Admitting: Orthopedic Surgery

## 2021-06-06 LAB — CBC
HCT: 39 % (ref 39.0–52.0)
Hemoglobin: 12.1 g/dL — ABNORMAL LOW (ref 13.0–17.0)
MCH: 25.3 pg — ABNORMAL LOW (ref 26.0–34.0)
MCHC: 31 g/dL (ref 30.0–36.0)
MCV: 81.4 fL (ref 80.0–100.0)
Platelets: 322 10*3/uL (ref 150–400)
RBC: 4.79 MIL/uL (ref 4.22–5.81)
RDW: 13 % (ref 11.5–15.5)
WBC: 10.9 10*3/uL — ABNORMAL HIGH (ref 4.0–10.5)
nRBC: 0 % (ref 0.0–0.2)

## 2021-06-06 LAB — BASIC METABOLIC PANEL
Anion gap: 9 (ref 5–15)
BUN: 9 mg/dL (ref 6–20)
CO2: 22 mmol/L (ref 22–32)
Calcium: 8.5 mg/dL — ABNORMAL LOW (ref 8.9–10.3)
Chloride: 104 mmol/L (ref 98–111)
Creatinine, Ser: 0.95 mg/dL (ref 0.61–1.24)
GFR, Estimated: >60 mL/min (ref >60)
Glucose, Bld: 114 mg/dL — ABNORMAL HIGH (ref 70–99)
Potassium: 4.2 mmol/L (ref 3.5–5.1)
Sodium: 135 mmol/L (ref 135–145)

## 2021-06-06 MED ORDER — VANCOMYCIN HCL 1750 MG/350ML IV SOLN
1750.0000 mg | INTRAVENOUS | Status: DC
  Administered 2021-06-06 – 2021-06-07 (×2): 1750 mg via INTRAVENOUS
  Filled 2021-06-06 (×3): qty 350

## 2021-06-06 NOTE — Progress Notes (Signed)
PROGRESS NOTE    Victor Richardson  ZYS:[id number] DOB: Dec 20, 1985 DOA: 06/04/2021 PCP: Pcp, No    Brief Narrative:  35 year old gentleman with no medical history who was treated with Augmentin after seen in ER for left hand trauma and swelling of the left elbow presented back to the emergency room with left elbow abscess, axillary abscess with spreading cellulitis.  Admitted and underwent surgical exploration.   Assessment & Plan:   Principal Problem:   Abscess of left arm  Abscess left elbow, I&D by Dr. Lajoyce Corners 12/30 Abscess and spreading cellulitis left axilla, I&D General surgery 12/30 Failed outpatient antibiotic therapy Blood cultures negative so far. Surgical cultures are pending, was already on antibiotics. Mobilize in the hallway.  Physical therapy, instructions for mobility of elbow and shoulder. Adequate pain medications. Anticipate surgical dressing changes tomorrow and discharged on oral antibiotics.  Hyponatremia: Due to dehydration.  Improved and normalized.    DVT prophylaxis: SCDs Start: 06/05/21 1434 SCDs Start: 06/05/21 0314   Code Status: Full code Family Communication: Sister at the bedside Disposition Plan: Status is: Inpatient  Remains inpatient appropriate because: Significant infection needing IV antibiotics.         Consultants:  Orthopedic surgery General surgery  Procedures:  Incision drainage  Antimicrobials:  Vancomycin and cefepime 12/30---   Subjective: Patient seen and examined.  His sister was at the bedside.  He walked around in the hallway.  Complains of pain on any mobility of the shoulder.  Afebrile.  Objective: Vitals:   06/05/21 1624 06/05/21 2002 06/06/21 0701 06/06/21 0842  BP:  (!) 103/55  115/67  Pulse:  65  82  Resp:  17  16  Temp: (!) 97.5 F (36.4 C) 97.6 F (36.4 C)  98.2 F (36.8 C)  TempSrc: Oral   Oral  SpO2:  97% 98% 98%  Weight:      Height:        Intake/Output Summary (Last 24 hours) at 06/06/2021  1502 Last data filed at 06/06/2021 0353 Gross per 24 hour  Intake 409.2 ml  Output --  Net 409.2 ml   Filed Weights   06/04/21 1638 06/05/21 0747  Weight: 65 kg 63.5 kg    Examination:  General exam: Appears calm and comfortable  Mildly anxious on mobility especially of the left shoulder. Respiratory system: Clear to auscultation. Respiratory effort normal. Cardiovascular system: S1-S2 normal.  Regular rate rhythm.   Gastrointestinal system: Abdomen is nondistended, soft and nontender. No organomegaly or masses felt. Normal bowel sounds heard. Central nervous system: Alert and oriented. No focal neurological deficits. Extremities: Symmetric 5 x 5 power. Left axilla, postoperative surgical wound with packing present.  Minimal tenderness around the dressing. Left elbow, surgical dressing present.  Not removed by me. Distal neurovascular status intact.    Data Reviewed: I have personally reviewed following labs and imaging studies  CBC: Recent Labs  Lab 06/01/21 1026 06/04/21 1746 06/05/21 0431 06/06/21 0205  WBC 8.5 12.1* 11.0* 10.9*  NEUTROABS 5.1 8.9*  --   --   HGB 14.7 13.7 13.2 12.1*  HCT 46.9 43.4 41.6 39.0  MCV 81.7 81.9 81.4 81.4  PLT 316 369 329 322   Basic Metabolic Panel: Recent Labs  Lab 06/01/21 1026 06/04/21 1746 06/05/21 0431 06/06/21 0205  NA 135 132* 130* 135  K 4.5 4.0 3.7 4.2  CL 101 102 101 104  CO2 26 24 22 22   GLUCOSE 99 158* 110* 114*  BUN 10 11 10 9   CREATININE 1.11 1.25*  1.13 0.95  CALCIUM 9.1 8.7* 8.5* 8.5*   GFR: Estimated Creatinine Clearance: 88.2 mL/min (by C-G formula based on SCr of 0.95 mg/dL). Liver Function Tests: Recent Labs  Lab 06/01/21 1026  AST 41  ALT 78*  ALKPHOS 90  BILITOT 0.9  PROT 8.4*  ALBUMIN 3.2*   No results for input(s): LIPASE, AMYLASE in the last 168 hours. No results for input(s): AMMONIA in the last 168 hours. Coagulation Profile: No results for input(s): INR, PROTIME in the last 168  hours. Cardiac Enzymes: No results for input(s): CKTOTAL, CKMB, CKMBINDEX, TROPONINI in the last 168 hours. BNP (last 3 results) No results for input(s): PROBNP in the last 8760 hours. HbA1C: No results for input(s): HGBA1C in the last 72 hours. CBG: No results for input(s): GLUCAP in the last 168 hours. Lipid Profile: No results for input(s): CHOL, HDL, LDLCALC, TRIG, CHOLHDL, LDLDIRECT in the last 72 hours. Thyroid Function Tests: No results for input(s): TSH, T4TOTAL, FREET4, T3FREE, THYROIDAB in the last 72 hours. Anemia Panel: No results for input(s): VITAMINB12, FOLATE, FERRITIN, TIBC, IRON, RETICCTPCT in the last 72 hours. Sepsis Labs: No results for input(s): PROCALCITON, LATICACIDVEN in the last 168 hours.  Recent Results (from the past 240 hour(s))  Aerobic/Anaerobic Culture w Gram Stain (surgical/deep wound)     Status: None (Preliminary result)   Collection Time: 06/04/21  3:45 PM   Specimen: PATH Other; Tissue  Result Value Ref Range Status   Specimen Description WOUND  Final   Special Requests ABSCESS LEFT ELBOW  Final   Gram Stain   Final    FEW WBC PRESENT,BOTH PMN AND MONONUCLEAR NO ORGANISMS SEEN    Culture   Final    NO GROWTH < 24 HOURS Performed at [facility], [address], [place], [place] [address]    Report Status PENDING  Incomplete  Blood culture (routine x 2)     Status: None (Preliminary result)   Collection Time: 06/05/21  1:04 AM   Specimen: BLOOD  Result Value Ref Range Status   Specimen Description BLOOD  Final   Special Requests   Final    BOTTLES DRAWN AEROBIC AND ANAEROBIC Blood Culture adequate volume   Culture   Final    NO GROWTH 1 DAY Performed at [facility], [address] [place], [place] [address]    Report Status PENDING  Incomplete  Blood culture (routine x 2)     Status: None (Preliminary result)   Collection Time: 06/05/21  1:09 AM   Specimen: BLOOD  Result Value Ref Range Status   Specimen Description  BLOOD LEFT ANTECUBITAL  Final   Special Requests   Final    BOTTLES DRAWN AEROBIC AND ANAEROBIC Blood Culture adequate volume   Culture   Final    NO GROWTH 1 DAY Performed at [facility], [address] [place], [place] [address]    Report Status PENDING  Incomplete  Resp Panel by RT-PCR (Flu A&B, Covid) Nasopharyngeal Swab     Status: None   Collection Time: 06/05/21  2:45 AM   Specimen: Nasopharyngeal Swab; Nasopharyngeal(NP) swabs in vial transport medium  Result Value Ref Range Status   SARS Coronavirus 2 by RT PCR NEGATIVE NEGATIVE Final    Comment: (NOTE) SARS-CoV-2 target nucleic acids are NOT DETECTED.  The SARS-CoV-2 RNA is generally detectable in upper respiratory specimens during the acute phase of infection. The lowest concentration of SARS-CoV-2 viral copies this assay can detect is 138  copies/mL. A negative result does not preclude SARS-Cov-2 infection and should not be used as the sole basis for treatment or other patient management decisions. A negative result may occur with  improper specimen collection/handling, submission of specimen other than nasopharyngeal swab, presence of viral mutation(s) within the areas targeted by this assay, and inadequate number of viral copies(<138 copies/mL). A negative result must be combined with clinical observations, patient history, and epidemiological information. The expected result is Negative.  Fact Sheet for Patients:  BloggerCourse.com  Fact Sheet for Healthcare Providers:  SeriousBroker.it  This test is no t yet approved or cleared by the [place] FDA and  has been authorized for detection and/or diagnosis of SARS-CoV-2 by FDA under an Emergency Use Authorization (EUA). This EUA will remain  in effect (meaning this test can be used) for the duration of the COVID-19 declaration under Section 564(b)(1) of the Act, 21 U.S.C.section 360bbb-3(b)(1), unless the  authorization is terminated  or revoked sooner.       Influenza A by PCR NEGATIVE NEGATIVE Final   Influenza B by PCR NEGATIVE NEGATIVE Final    Comment: (NOTE) The Xpert Xpress SARS-CoV-2/FLU/RSV plus assay is intended as an aid in the diagnosis of influenza from Nasopharyngeal swab specimens and should not be used as a sole basis for treatment. Nasal washings and aspirates are unacceptable for Xpert Xpress SARS-CoV-2/FLU/RSV testing.  Fact Sheet for Patients: BloggerCourse.com  Fact Sheet for Healthcare Providers: SeriousBroker.it  This test is not yet approved or cleared by the [place] FDA and has been authorized for detection and/or diagnosis of SARS-CoV-2 by FDA under an Emergency Use Authorization (EUA). This EUA will remain in effect (meaning this test can be used) for the duration of the COVID-19 declaration under Section 564(b)(1) of the Act, 21 U.S.C. section 360bbb-3(b)(1), unless the authorization is terminated or revoked.  Performed at [facility], [address], [place], [place] [address]   Aerobic/Anaerobic Culture w Gram Stain (surgical/deep wound)     Status: None (Preliminary result)   Collection Time: 06/05/21  9:12 AM   Specimen: PATH Soft tissue  Result Value Ref Range Status   Specimen Description TISSUE  Final   Special Requests LEFT ELBOW ABSCESS SPEC A  Final   Gram Stain   Final    MODERATE WBC PRESENT,BOTH PMN AND MONONUCLEAR NO ORGANISMS SEEN    Culture   Final    NO GROWTH 1 DAY Performed at [facility], [address], [place], [place] [address]    Report Status PENDING  Incomplete  Aerobic/Anaerobic Culture w Gram Stain (surgical/deep wound)     Status: None (Preliminary result)   Collection Time: 06/05/21  9:17 AM   Specimen: PATH Other; Tissue  Result Value Ref Range Status   Specimen Description WOUND  Final   Special Requests LEFT AXILLARY ABSCESS SPEC B  Final    Gram Stain   Final    FEW WBC PRESENT, PREDOMINANTLY PMN NO ORGANISMS SEEN    Culture   Final    NO GROWTH 1 DAY Performed at [facility], [address], [place], [place] [address]    Report Status PENDING  Incomplete         Radiology Studies: CT Extrem Up Entire Arm L W/CM  Result Date: 06/05/2021 CLINICAL DATA:  Soft tissue infection suspected. EXAM: CT OF THE UPPER LEFT EXTREMITY WITH CONTRAST TECHNIQUE: Multidetector CT imaging of the upper left extremity was performed according to the standard  protocol following intravenous contrast administration. CONTRAST:  80mL OMNIPAQUE IOHEXOL 350 MG/ML SOLN COMPARISON:  None. FINDINGS: Bones/Joint/Cartilage No acute fracture or dislocation. The bones are well mineralized. No arthritic changes. No joint effusion. The visualized major arteries of the left upper extremity appear patent. Ligaments Suboptimally assessed by CT. Muscles and Tendons No intramuscular hematoma fluid collection. Soft tissues There is a complex collection with enhancing walls in the subcutaneous soft tissues of the left axilla measuring 5.6 x 3.7 cm in greatest axial dimensions and 7.7 cm in craniocaudal length most consistent with an abscess. Additional complex collection with enhancing walls measure 3.3 x 2.8 x 4.2 cm in the subcutaneous and superficial soft tissues of the medial distal arm. IMPRESSION: 1. No acute fracture or dislocation. 2. Abscesses of the left axilla and left upper extremity. Electronically Signed   By: Elgie Collard M.D.   On: 06/05/2021 00:33        Scheduled Meds:  docusate sodium  100 mg Oral BID   Continuous Infusions:  sodium chloride Stopped (06/05/21 2228)   ceFEPime (MAXIPIME) IV 2 g (06/06/21 1425)   methocarbamol (ROBAXIN) IV     vancomycin       LOS: 1 day    Time spent: 25 minutes    Dorcas Carrow, MD [facility] Pager [phone]

## 2021-06-06 NOTE — Progress Notes (Signed)
1 Day Post-Op   Subjective/Chief Complaint: Pt with no c/o overnight   Objective: Vital signs in last 24 hours: Temp:  [97.3 F (36.3 C)-98.2 F (36.8 C)] 98.2 F (36.8 C) (12/31 0842) Pulse Rate:  [65-82] 82 (12/31 0842) Resp:  [16-17] 16 (12/31 0842) BP: (103-115)/(55-67) 115/67 (12/31 0842) SpO2:  [97 %-98 %] 98 % (12/31 0842) Last BM Date: 06/05/21  Intake/Output from previous day: 12/30 0701 - 12/31 0700 In: 1278.4 [I.V.:828.4; IV Piggyback:450] Out: 30 [Blood:30] Intake/Output this shift: No intake/output data recorded.  Incision/Wound:inc packed c/d/I   Lab Results:  Recent Labs    06/05/21 0431 06/06/21 0205  WBC 11.0* 10.9*  HGB 13.2 12.1*  HCT 41.6 39.0  PLT 329 322   BMET Recent Labs    06/05/21 0431 06/06/21 0205  NA 130* 135  K 3.7 4.2  CL 101 104  CO2 22 22  GLUCOSE 110* 114*  BUN 10 9  CREATININE 1.13 0.95  CALCIUM 8.5* 8.5*   PT/INR No results for input(s): LABPROT, INR in the last 72 hours. ABG No results for input(s): PHART, HCO3 in the last 72 hours.  Invalid input(s): PCO2, PO2  Studies/Results: CT Extrem Up Entire Arm L W/CM  Result Date: 06/05/2021 CLINICAL DATA:  Soft tissue infection suspected. EXAM: CT OF THE UPPER LEFT EXTREMITY WITH CONTRAST TECHNIQUE: Multidetector CT imaging of the upper left extremity was performed according to the standard protocol following intravenous contrast administration. CONTRAST:  33mL OMNIPAQUE IOHEXOL 350 MG/ML SOLN COMPARISON:  None. FINDINGS: Bones/Joint/Cartilage No acute fracture or dislocation. The bones are well mineralized. No arthritic changes. No joint effusion. The visualized major arteries of the left upper extremity appear patent. Ligaments Suboptimally assessed by CT. Muscles and Tendons No intramuscular hematoma fluid collection. Soft tissues There is a complex collection with enhancing walls in the subcutaneous soft tissues of the left axilla measuring 5.6 x 3.7 cm in greatest axial  dimensions and 7.7 cm in craniocaudal length most consistent with an abscess. Additional complex collection with enhancing walls measure 3.3 x 2.8 x 4.2 cm in the subcutaneous and superficial soft tissues of the medial distal arm. IMPRESSION: 1. No acute fracture or dislocation. 2. Abscesses of the left axilla and left upper extremity. Electronically Signed   By: Elgie Collard M.D.   On: 06/05/2021 00:33    Anti-infectives: Anti-infectives (From admission, onward)    Start     Dose/Rate Route Frequency Ordered Stop   06/06/21 2200  vancomycin (VANCOREADY) IVPB 1750 mg/350 mL        1,750 mg 175 mL/hr over 120 Minutes Intravenous Every 24 hours 06/06/21 0852     06/05/21 2200  vancomycin (VANCOREADY) IVPB 1250 mg/250 mL  Status:  Discontinued        1,250 mg 166.7 mL/hr over 90 Minutes Intravenous Every 24 hours 06/05/21 0334 06/06/21 0852   06/05/21 1200  ceFEPIme (MAXIPIME) 2 g in sodium chloride 0.9 % 100 mL IVPB        2 g 200 mL/hr over 30 Minutes Intravenous Every 8 hours 06/05/21 0329     06/05/21 0800  ceFAZolin (ANCEF) IVPB 2g/100 mL premix        2 g 200 mL/hr over 30 Minutes Intravenous On call to O.R. 06/05/21 7341 06/05/21 0853   06/05/21 0752  ceFAZolin (ANCEF) 2-4 GM/100ML-% IVPB       Note to Pharmacy: Launa Flight M: cabinet override      06/05/21 0752 06/05/21 0853   06/05/21 0130  ceFEPIme (MAXIPIME) 2,000 mg in sodium chloride 0.9 % 100 mL IVPB        2,000 mg 200 mL/hr over 30 Minutes Intravenous  Once 06/05/21 0119 06/05/21 0506   06/05/21 0115  vancomycin (VANCOREADY) IVPB 1250 mg/250 mL        1,250 mg 166.7 mL/hr over 90 Minutes Intravenous  Once 06/05/21 0103 06/05/21 0425       Assessment/Plan: s/p Procedure(s): IRRIGATION AND DEBRIDEMENT ELBOW (Left) IRRIGATION AND DEBRIDEMENT AXILLARY ABSCESS (Left) Continue ABX therapy due to Post-op infection Will plan on DC dressing in AM   LOS: 1 day    Axel Filler 06/06/2021

## 2021-06-06 NOTE — Plan of Care (Signed)

## 2021-06-06 NOTE — Evaluation (Signed)
Physical Therapy Evaluation Patient Details Name: Victor Richardson MRN: [id number] DOB: 08/09/1985 Today's Date: 06/06/2021  History of Present Illness  Victor Richardson admitted on 12/30 for L antecubital fossa and L axillary abscess s/p I&D. PMH: unremarkable PSH: circumcision as adult   Clinical Impression  Victor Richardson admitted with above. Victor Richardson with noted limited shoulder and elbow ROM as expected due to pain and dressings. Educated on ROM exercises to shoulder and elbow. Victor Richardson independent with mobility and function. Victor Richardson to follow up to aide in continued ROM to shld and elbow and progression of exercises.        Recommendations for follow up therapy are one component of a multi-disciplinary discharge planning process, led by the attending physician.  Recommendations may be updated based on patient status, additional functional criteria and insurance authorization.  Follow Up Recommendations No Victor Richardson follow up (pending follow if L shlder/elbow with continued restricted ROM, would benefit from Outpt therapy)    Assistance Recommended at Discharge None  Functional Status Assessment Patient has had a recent decline in their functional status and demonstrates the ability to make significant improvements in function in a reasonable and predictable amount of time.  Equipment Recommendations  None recommended by Victor Richardson    Recommendations for Other Services       Precautions / Restrictions Precautions Precautions: None Restrictions Weight Bearing Restrictions: No      Mobility  Bed Mobility Overal bed mobility: Independent                  Transfers Overall transfer level: Independent Equipment used: None               General transfer comment: no difficulty    Ambulation/Gait Ambulation/Gait assistance: Independent Gait Distance (Feet): 300 Feet Assistive device: None Gait Pattern/deviations: WFL(Within Functional Limits) Gait velocity: wfl Gait velocity interpretation: >4.37 ft/sec, indicative of  normal walking speed   General Gait Details: wfl  Stairs Stairs: Yes Stairs assistance: Independent Stair Management: No rails;Alternating pattern;Forwards Number of Stairs: 4 General stair comments: no difficulty  Wheelchair Mobility    Modified Rankin (Stroke Patients Only)       Balance Overall balance assessment: No apparent balance deficits (not formally assessed)                                           Pertinent Vitals/Pain Pain Assessment: 0-10 Pain Score: 9  Pain Location: 9/10 in L axilla, 8//10 at L elbow    Home Living Family/patient expects to be discharged to:: Private residence Living Arrangements: Spouse/significant other (and mother in Social worker) Available Help at Discharge: Family;Available 24 hours/day (wife works at night, mother in Social worker there all the time) Type of Home: House Home Access: Stairs to enter Entrance Stairs-Rails: Left Entrance Stairs-Number of Steps: 3   Home Layout: One level Home Equipment: None      Prior Function Prior Level of Function : Independent/Modified Independent             Mobility Comments: working       Higher education careers adviser   Dominant Hand: Right    Extremity/Trunk Assessment   Upper Extremity Assessment Upper Extremity Assessment: LUE deficits/detail LUE Deficits / Details: able to abd AA to 90 deg, active flex 45 deg, limited elbow flex/extension due to dressing and I&D LUE Coordination:  (good fine motor dexterity)    Lower Extremity Assessment Lower Extremity Assessment: Overall  WFL for tasks assessed    Cervical / Trunk Assessment Cervical / Trunk Assessment: Normal  Communication   Communication: Prefers language other than English  Cognition Arousal/Alertness: Awake/alert Behavior During Therapy: WFL for tasks assessed/performed Overall Cognitive Status: Within Functional Limits for tasks assessed                                          General Comments  General comments (skin integrity, edema, etc.): L elbow with coband and dressing, no noted drainage, L axialla with gauze/dressing, no drainage noted    Exercises Other Exercises Other Exercises: worked on AA L shld ROM in all directions to the point of discomfort, limited by pain and tape of dressing, completed shoulder rolls Other Exercises: L elbow extension and flexion, limited by coband bandage   Assessment/Plan    Victor Richardson Assessment Patient needs continued Victor Richardson services  Victor Richardson Problem List Decreased strength;Decreased range of motion       Victor Richardson Treatment Interventions DME instruction;Gait training;Stair training;Functional mobility training    Victor Richardson Goals (Current goals can be found in the Care Plan section)  Acute Rehab Victor Richardson Goals Patient Stated Goal: home Victor Richardson Goal Formulation: With patient Time For Goal Achievement: 06/12/21 Potential to Achieve Goals: Good    Frequency Min 3X/week   Barriers to discharge        Co-evaluation               AM-PAC Victor Richardson "6 Clicks" Mobility  Outcome Measure Help needed turning from your back to your side while in a flat bed without using bedrails?: None Help needed moving from lying on your back to sitting on the side of a flat bed without using bedrails?: None Help needed moving to and from a bed to a chair (including a wheelchair)?: None Help needed standing up from a chair using your arms (e.g., wheelchair or bedside chair)?: None Help needed to walk in hospital room?: None Help needed climbing 3-5 steps with a railing? : None 6 Click Score: 24    End of Session   Activity Tolerance: Patient tolerated treatment well Patient left: in bed;with call bell/phone within reach;with family/visitor present Nurse Communication: Mobility status Victor Richardson Visit Diagnosis: Pain;Other (comment) (limited L UE ROM) Pain - Right/Left: Left Pain - part of body: Shoulder (elbow)    Time: 0263-7858 Victor Richardson Time Calculation (min) (ACUTE ONLY): 17 min   Charges:   Victor Richardson  Evaluation $Victor Richardson Eval Low Complexity: 1 Low          Victor Richardson, Victor Richardson, Victor Richardson Acute Rehabilitation Services Pager #: [phone] Office #: [phone]   Iona Hansen 06/06/2021, 11:18 AM

## 2021-06-06 NOTE — Progress Notes (Signed)
Pharmacy Antibiotic Note  Victor Richardson is a 35 y.o. male admitted on 06/04/2021 with  wound infection/abscess .  Pharmacy has been consulted for Vancomycin/Cefepime dosing. WBC mildly elevated. Renal function improved to 0.95 today requiring vancomycin dose adjustment.   Plan: Increase to vancomycin 1750 mg IV q24h >>>Estimated AUC: 493 (Scr 0.95) Cefepime 2g IV q8h Trend WBC, temp, renal function  F/U infectious work-up Drug levels as indicated   Height: 5\' 3"  (160 cm) Weight: 63.5 kg (140 lb) IBW/kg (Calculated) : 56.9  Temp (24hrs), Avg:97.7 F (36.5 C), Min:97.3 F (36.3 C), Max:98.2 F (36.8 C)  Recent Labs  Lab 06/01/21 1026 06/04/21 1746 06/05/21 0431 06/06/21 0205  WBC 8.5 12.1* 11.0* 10.9*  CREATININE 1.11 1.25* 1.13 0.95     Estimated Creatinine Clearance: 88.2 mL/min (by C-G formula based on SCr of 0.95 mg/dL).    No Known Allergies  06/08/21, PharmD, [facility] Resident [phone] 06/06/2021 8:54 AM

## 2021-06-06 NOTE — Progress Notes (Signed)
Patient ID: Victor Richardson, male   DOB: 03-18-1986, 35 y.o.   MRN: [id number] Patient is postoperative day 1 irrigation and debridement abscess left elbow.  Cultures are pending.  Patient's hand is neurovascularly intact.  Good motor function.  Plan for therapy without restrictions.  Discharge on oral antibiotics when cultures finalized.

## 2021-06-07 LAB — BASIC METABOLIC PANEL
Anion gap: 8 (ref 5–15)
BUN: 12 mg/dL (ref 6–20)
CO2: 26 mmol/L (ref 22–32)
Calcium: 8.7 mg/dL — ABNORMAL LOW (ref 8.9–10.3)
Chloride: 102 mmol/L (ref 98–111)
Creatinine, Ser: 0.94 mg/dL (ref 0.61–1.24)
GFR, Estimated: >60 mL/min (ref >60)
Glucose, Bld: 98 mg/dL (ref 70–99)
Potassium: 4.2 mmol/L (ref 3.5–5.1)
Sodium: 136 mmol/L (ref 135–145)

## 2021-06-07 LAB — CBC
HCT: 40 % (ref 39.0–52.0)
Hemoglobin: 12.3 g/dL — ABNORMAL LOW (ref 13.0–17.0)
MCH: 25.3 pg — ABNORMAL LOW (ref 26.0–34.0)
MCHC: 30.8 g/dL (ref 30.0–36.0)
MCV: 82.3 fL (ref 80.0–100.0)
Platelets: 384 10*3/uL (ref 150–400)
RBC: 4.86 MIL/uL (ref 4.22–5.81)
RDW: 13.1 % (ref 11.5–15.5)
WBC: 9.6 10*3/uL (ref 4.0–10.5)
nRBC: 0 % (ref 0.0–0.2)

## 2021-06-07 MED ORDER — DOXYCYCLINE MONOHYDRATE 100 MG PO TABS: 100.0000 mg | ORAL_TABLET | Freq: Two times a day (BID) | ORAL | 0 refills | Status: AC

## 2021-06-07 MED ORDER — AMOXICILLIN-POT CLAVULANATE 875-125 MG PO TABS: 1.0000 | ORAL_TABLET | Freq: Two times a day (BID) | ORAL | 0 refills | Status: AC

## 2021-06-07 NOTE — Plan of Care (Signed)

## 2021-06-07 NOTE — Progress Notes (Signed)
Pt stated he wasn't comfortable going home just yet he wanted to speak the MD. And see surgeon for his arm. Marrion Coy, MD was notified and made aware.

## 2021-06-07 NOTE — Progress Notes (Signed)
2 Days Post-Op   Subjective/Chief Complaint: Comfortable this morning Pain controlled   Objective: Vital signs in last 24 hours: Temp:  [97.8 F (36.6 C)-98.4 F (36.9 C)] 97.8 F (36.6 C) (01/01 0830) Pulse Rate:  [73-79] 75 (01/01 0830) Resp:  [16-20] 20 (01/01 0830) BP: (105-111)/(63-68) 107/67 (01/01 0830) SpO2:  [97 %-100 %] 100 % (01/01 0830) Last BM Date: 06/06/21  Intake/Output from previous day: No intake/output data recorded. Intake/Output this shift: No intake/output data recorded.  Exam: Awake and alert I removed the left axillary wound packing.  No further purulence found. Wound left open with dry guaze place over the wound  Lab Results:  Recent Labs    06/06/21 0205 06/07/21 0154  WBC 10.9* 9.6  HGB 12.1* 12.3*  HCT 39.0 40.0  PLT 322 384   BMET Recent Labs    06/06/21 0205 06/07/21 0154  NA 135 136  K 4.2 4.2  CL 104 102  CO2 22 26  GLUCOSE 114* 98  BUN 9 12  CREATININE 0.95 0.94  CALCIUM 8.5* 8.7*   PT/INR No results for input(s): LABPROT, INR in the last 72 hours. ABG No results for input(s): PHART, HCO3 in the last 72 hours.  Invalid input(s): PCO2, PO2  Studies/Results: No results found.  Anti-infectives: Anti-infectives (From admission, onward)    Start     Dose/Rate Route Frequency Ordered Stop   06/06/21 2200  vancomycin (VANCOREADY) IVPB 1750 mg/350 mL        1,750 mg 175 mL/hr over 120 Minutes Intravenous Every 24 hours 06/06/21 0852     06/05/21 2200  vancomycin (VANCOREADY) IVPB 1250 mg/250 mL  Status:  Discontinued        1,250 mg 166.7 mL/hr over 90 Minutes Intravenous Every 24 hours 06/05/21 0334 06/06/21 0852   06/05/21 1200  ceFEPIme (MAXIPIME) 2 g in sodium chloride 0.9 % 100 mL IVPB        2 g 200 mL/hr over 30 Minutes Intravenous Every 8 hours 06/05/21 0329     06/05/21 0800  ceFAZolin (ANCEF) IVPB 2g/100 mL premix        2 g 200 mL/hr over 30 Minutes Intravenous On call to O.R. 06/05/21 0751 06/05/21 0853    06/05/21 0752  ceFAZolin (ANCEF) 2-4 GM/100ML-% IVPB       Note to Pharmacy: Effie Shy: cabinet override      06/05/21 0752 06/05/21 0853   06/05/21 0130  ceFEPIme (MAXIPIME) 2,000 mg in sodium chloride 0.9 % 100 mL IVPB        2,000 mg 200 mL/hr over 30 Minutes Intravenous  Once 06/05/21 0119 06/05/21 0506   06/05/21 0115  vancomycin (VANCOREADY) IVPB 1250 mg/250 mL        1,250 mg 166.7 mL/hr over 90 Minutes Intravenous  Once 06/05/21 0103 06/05/21 0425       Assessment/Plan: s/p Procedure(s): IRRIGATION AND DEBRIDEMENT ELBOW (Left) IRRIGATION AND DEBRIDEMENT AXILLARY ABSCESS (Left)  Packing removed from the axilla Only need to cover wound with dry gauze and change prn.  No need for re-packing From a general surgery standpoint, he can go home on oral antibiotics when ready from a medical standpoint  Victor Richardson 06/07/2021

## 2021-06-07 NOTE — Progress Notes (Signed)
Patient ID: Victor Richardson, male   DOB: 11-Feb-1986, 36 y.o.   MRN: [id number]   Subjective: 2 Days Post-Op Procedure(s) (LRB): IRRIGATION AND DEBRIDEMENT ELBOW (Left) IRRIGATION AND DEBRIDEMENT AXILLARY ABSCESS (Left) Patient reports pain as mild.    Objective: Vital signs in last 24 hours: Temp:  [97.8 F (36.6 C)-98.4 F (36.9 C)] 97.8 F (36.6 C) (01/01 0830) Pulse Rate:  [73-79] 75 (01/01 0830) Resp:  [16-20] 20 (01/01 0830) BP: (105-111)/(63-68) 107/67 (01/01 0830) SpO2:  [97 %-100 %] 100 % (01/01 0830)  Intake/Output from previous day: No intake/output data recorded. Intake/Output this shift: No intake/output data recorded.  Recent Labs    06/04/21 1746 06/05/21 0431 06/06/21 0205 06/07/21 0154  HGB 13.7 13.2 12.1* 12.3*   Recent Labs    06/06/21 0205 06/07/21 0154  WBC 10.9* 9.6  RBC 4.79 4.86  HCT 39.0 40.0  PLT 322 384   Recent Labs    06/06/21 0205 06/07/21 0154  NA 135 136  K 4.2 4.2  CL 104 102  CO2 22 26  BUN 9 12  CREATININE 0.95 0.94  GLUCOSE 114* 98  CALCIUM 8.5* 8.7*   No results for input(s): LABPT, INR in the last 72 hours.  Dressing intact No results found.  Assessment/Plan: 2 Days Post-Op Procedure(s) (LRB): IRRIGATION AND DEBRIDEMENT ELBOW (Left) IRRIGATION AND DEBRIDEMENT AXILLARY ABSCESS (Left) Plan:  waiting on cultures, then antibiotic specific treatment and discharge.   Eldred Manges 06/07/2021, 9:15 AM

## 2021-06-07 NOTE — Discharge Summary (Signed)
Physician Discharge Summary  Patient ID: Victor Richardson MRN: [id number] DOB/AGE: August 20, 1985 36 y.o.  Admit date: 06/04/2021 Discharge date: 06/07/2021  Admission Diagnoses:  Discharge Diagnoses:  Principal Problem:   Abscess of left arm Hyponatremia  Discharged Condition: good  Hospital Course:  36 year old gentleman with no medical history who was treated with Augmentin after seen in ER for left hand trauma and swelling of the left elbow presented back to the emergency room with left elbow abscess, axillary abscess with spreading cellulitis.  Admitted and underwent surgical exploration. I&D of left elbow and axilla was performed by Dr. Sharol Given.  Culture negative in 48 hours.  General surgery is also following, recommended covering up with dry gauze, and clean intermittently.  Patient currently is medically stable to be discharged.  We will continue 5 days antibiotics with Augmentin and doxycycline.    Consults: general surgery and orthopedic surgery  Significant Diagnostic Studies:   Treatments: Cefepime and vancomycin  Discharge Exam: Blood pressure 107/67, pulse 75, temperature 97.8 F (36.6 C), temperature source Oral, resp. rate 20, height 5\' 3"  (1.6 m), weight 63.5 kg, SpO2 100 %. General appearance: alert and cooperative Resp: clear to auscultation bilaterally Cardio: regular rate and rhythm, S1, S2 normal, no murmur, click, rub or gallop GI: soft, non-tender; bowel sounds normal; no masses,  no organomegaly Extremities: extremities normal, atraumatic, no cyanosis or edema  Disposition: Discharge disposition: 01-Home or Self Care       Discharge Instructions     Diet - low sodium heart healthy   Complete by: As directed    Discharge wound care:   Complete by: As directed    Cover with dry gauze, wash intermittently.   Increase activity slowly   Complete by: As directed       Allergies as of 06/07/2021   No Known Allergies      Medication List     STOP taking  these medications    cephALEXin 500 MG capsule Commonly known as: KEFLEX   naproxen sodium 220 MG tablet Commonly known as: ALEVE   sulfamethoxazole-trimethoprim 800-160 MG tablet Commonly known as: BACTRIM DS       TAKE these medications    acetaminophen 325 MG tablet Commonly known as: TYLENOL Take 650 mg by mouth every 6 (six) hours as needed for mild pain.   amoxicillin-clavulanate 875-125 MG tablet Commonly known as: Augmentin Take 1 tablet by mouth 2 (two) times daily for 5 days.   doxycycline 100 MG tablet Commonly known as: ADOXA Take 1 tablet (100 mg total) by mouth 2 (two) times daily for 5 days.   ibuprofen 600 MG tablet Commonly known as: ADVIL Take 1 tablet (600 mg total) by mouth every 6 (six) hours as needed for moderate pain.               Discharge Care Instructions  (From admission, onward)           Start     Ordered   06/07/21 0000  Discharge wound care:       Comments: Cover with dry gauze, wash intermittently.   06/07/21 1028            Follow-up Information     Newt Minion, MD Follow up in 1 week(s).   Specialty: Orthopedic Surgery Contact information: [place] [address] [phone]         [place]. Call.   Specialty: Family Medicine Why: for hospital follow up appointment and to  establish for primary care Contact information: [place] [phone]        Coralie Keens, MD Follow up in 1 week(s).   Specialty: General Surgery Contact information: [place] [address] [phone]                 Signed: Sharen Hones 06/07/2021, 10:29 AM

## 2021-06-08 DIAGNOSIS — E871 Hypo-osmolality and hyponatremia: Secondary | ICD-10-CM

## 2021-06-08 NOTE — Progress Notes (Signed)
Discharge instructions (including medications) discussed with and copy provided to patient/caregiver 

## 2021-06-08 NOTE — TOC Progression Note (Signed)
Transition of Care [facility]) - Progression Note    Patient Details  Name: Timari Suchanek MRN: [id number] Date of Birth: Oct 13, 1985  [facility]) CM/SW [place], RN Phone Number:[phone]  06/08/2021, 11:07 AM  Clinical Narrative:     Transition of Care (TOC) Screening Note   Patient Details  Name: Seena Zimmerer Date of Birth: Oct 18, 1985   [facility] of [facility]) CM/SW Contact:    Angelita Ingles, RN Phone Number: 06/08/2021, 11:07 AM    Transition of Care Department (TOC) has reviewed patient and no TOC needs have been identified at this time. We will continue to monitor patient advancement through interdisciplinary progression rounds. If new patient transition needs arise, please place a TOC consult.          Expected Discharge Plan and Services           Expected Discharge Date: 06/08/21                                     Social Determinants of Health (SDOH) Interventions    Readmission Risk Interventions No flowsheet data found.

## 2021-06-08 NOTE — Discharge Summary (Signed)
Physician Discharge Summary  Patient ID: Victor Richardson MRN: [id number] DOB/AGE: 02-08-1986 37 y.o.  Admit date: 06/04/2021 Discharge date: 06/08/2021  Admission Diagnoses:  Discharge Diagnoses:  Principal Problem:   Abscess of left arm Hyponatremia  Discharged Condition: good  Hospital Course:  36 year old gentleman with no medical history who was treated with Augmentin after seen in ER for left hand trauma and swelling of the left elbow presented back to the emergency room with left elbow abscess, axillary abscess with spreading cellulitis.  Admitted and underwent surgical exploration. I&D of left elbow and axilla was performed by Dr. Lajoyce Corners.  Culture negative in 48 hours.  General surgery is also following, recommended covering up with dry gauze, and clean intermittently.  Patient currently is medically stable to be discharged.  We will continue 5 days antibiotics with Augmentin and doxycycline.  Patient was discharged yesterday, but he did not want to go.  He was seen by general surgery again today, wound has improved.  We will need to keep the drain in 1 week and see by orthopedics.  Sutures need removed at that visit with orthopedics. Is still medically stable to be discharged  Consults: general surgery and orthopedic surgery  Significant Diagnostic Studies:   Treatments: antibiotics  Discharge Exam: Blood pressure 103/69, pulse 80, temperature 97.8 F (36.6 C), temperature source Oral, resp. rate 20, height 5\' 3"  (1.6 m), weight 63.5 kg, SpO2 97 %. General appearance: alert and cooperative Resp: clear to auscultation bilaterally Cardio: regular rate and rhythm, S1, S2 normal, no murmur, click, rub or gallop GI: soft, non-tender; bowel sounds normal; no masses,  no organomegaly Extremities: extremities normal, atraumatic, no cyanosis or edema Left axilla has minimal secretion, elbow has a drain, with some secretion, but no skin swelling or redness Disposition: Discharge  disposition: 01-Home or Self Care       Discharge Instructions     Diet - low sodium heart healthy   Complete by: As directed    Discharge wound care:   Complete by: As directed    Cover with dry gauze, wash intermittently.   Increase activity slowly   Complete by: As directed       Allergies as of 06/08/2021   No Known Allergies      Medication List     STOP taking these medications    cephALEXin 500 MG capsule Commonly known as: KEFLEX   naproxen sodium 220 MG tablet Commonly known as: ALEVE   sulfamethoxazole-trimethoprim 800-160 MG tablet Commonly known as: BACTRIM DS       TAKE these medications    acetaminophen 325 MG tablet Commonly known as: TYLENOL Take 650 mg by mouth every 6 (six) hours as needed for mild pain.   amoxicillin-clavulanate 875-125 MG tablet Commonly known as: Augmentin Take 1 tablet by mouth 2 (two) times daily for 5 days.   doxycycline 100 MG tablet Commonly known as: ADOXA Take 1 tablet (100 mg total) by mouth 2 (two) times daily for 5 days.   ibuprofen 600 MG tablet Commonly known as: ADVIL Take 1 tablet (600 mg total) by mouth every 6 (six) hours as needed for moderate pain.               Discharge Care Instructions  (From admission, onward)           Start     Ordered   06/07/21 0000  Discharge wound care:       Comments: Cover with dry gauze, wash intermittently.   06/07/21  1028            Follow-up Information     Nadara Mustard, MD Follow up in 1 week(s).   Specialty: Orthopedic Surgery Contact information: [address] [place] [address] [phone]         [facility]. Call.   Specialty: Family Medicine Why: for hospital follow up appointment and to establish for primary care Contact information: [address] [place] [facility] [address] [phone]        Abigail Miyamoto, MD Follow up in 1 week(s).   Specialty: General  Surgery Contact information: [address] [place] [address] [phone]                 Signed: Marrion Coy 06/08/2021, 9:39 AM

## 2021-06-08 NOTE — Progress Notes (Incomplete)
3 Days Post-Op   Subjective/Chief Complaint: Comfortable this morning Pain controlled   Objective: Vital signs in last 24 hours: Temp:  [97.8 F (36.6 C)-98.2 F (36.8 C)] 97.8 F (36.6 C) (01/02 0731) Pulse Rate:  [72-86] 80 (01/02 0848) Resp:  [18-20] 20 (01/02 0731) BP: (81-113)/(51-74) 103/69 (01/02 0848) SpO2:  [95 %-97 %] 97 % (01/02 0848) Last BM Date: 06/07/21  Intake/Output from previous day: 01/01 0701 - 01/02 0700 In: 400 [IV Piggyback:400] Out: -  Intake/Output this shift: No intake/output data recorded.  Exam: Awake and alert I removed the left axillary wound packing.  No further purulence found. Wound left open with dry guaze place over the wound  Lab Results:  Recent Labs    06/06/21 0205 06/07/21 0154  WBC 10.9* 9.6  HGB 12.1* 12.3*  HCT 39.0 40.0  PLT 322 384    BMET Recent Labs    06/06/21 0205 06/07/21 0154  NA 135 136  K 4.2 4.2  CL 104 102  CO2 22 26  GLUCOSE 114* 98  BUN 9 12  CREATININE 0.95 0.94  CALCIUM 8.5* 8.7*    PT/INR No results for input(s): LABPROT, INR in the last 72 hours. ABG No results for input(s): PHART, HCO3 in the last 72 hours.  Invalid input(s): PCO2, PO2  Studies/Results: No results found.  Anti-infectives: Anti-infectives (From admission, onward)    Start     Dose/Rate Route Frequency Ordered Stop   06/07/21 0000  amoxicillin-clavulanate (AUGMENTIN) 875-125 MG tablet        1 tablet Oral 2 times daily 06/07/21 1028 06/12/21 2359   06/07/21 0000  doxycycline (ADOXA) 100 MG tablet        100 mg Oral 2 times daily 06/07/21 1028 06/12/21 2359   06/06/21 2200  vancomycin (VANCOREADY) IVPB 1750 mg/350 mL        1,750 mg 175 mL/hr over 120 Minutes Intravenous Every 24 hours 06/06/21 0852     06/05/21 2200  vancomycin (VANCOREADY) IVPB 1250 mg/250 mL  Status:  Discontinued        1,250 mg 166.7 mL/hr over 90 Minutes Intravenous Every 24 hours 06/05/21 0334 06/06/21 0852   06/05/21 1200  ceFEPIme  (MAXIPIME) 2 g in sodium chloride 0.9 % 100 mL IVPB        2 g 200 mL/hr over 30 Minutes Intravenous Every 8 hours 06/05/21 0329     06/05/21 0800  ceFAZolin (ANCEF) IVPB 2g/100 mL premix        2 g 200 mL/hr over 30 Minutes Intravenous On call to O.R. 06/05/21 0751 06/05/21 0853   06/05/21 0752  ceFAZolin (ANCEF) 2-4 GM/100ML-% IVPB       Note to Pharmacy: Effie Shy: cabinet override      06/05/21 0752 06/05/21 0853   06/05/21 0130  ceFEPIme (MAXIPIME) 2,000 mg in sodium chloride 0.9 % 100 mL IVPB        2,000 mg 200 mL/hr over 30 Minutes Intravenous  Once 06/05/21 0119 06/05/21 0506   06/05/21 0115  vancomycin (VANCOREADY) IVPB 1250 mg/250 mL        1,250 mg 166.7 mL/hr over 90 Minutes Intravenous  Once 06/05/21 0103 06/05/21 0425       Assessment/Plan: s/p Procedure(s): IRRIGATION AND DEBRIDEMENT ELBOW (Left) IRRIGATION AND DEBRIDEMENT AXILLARY ABSCESS (Left)  Packing removed from the axilla 1/1 Only need to cover wound with dry gauze and change prn.  No need for re-packing From a general surgery standpoint, he can go  home on oral antibiotics when ready from a medical standpoint  Eric Form, [facility] 06/08/2021, 8:52 AM Please see Amion for pager number during day hours 7:00am-4:30pm

## 2021-06-08 NOTE — Progress Notes (Signed)
Physical Therapy Treatment Patient Details Name: Victor Richardson MRN: [id number] DOB: [birth year] Today's Date: 06/08/2021   History of Present Illness Pt admitted on 12/30 for L antecubital fossa and L axillary abscess s/p I&D. PMH: unremarkable PSH: circumcision as adult    PT Comments    Pt progressing slowly with L elbow and shoulder ROM. Pt given HEP from [facility], spouse and pt with good understanding. Pt functioning independently from mobility standpoint. Acute PT to cont to follow to progress ROM and strength of L UE.    Recommendations for follow up therapy are one component of a multi-disciplinary discharge planning process, led by the attending physician.  Recommendations may be updated based on patient status, additional functional criteria and insurance authorization.  Follow Up Recommendations  No PT follow up (pending progressing at follow up may need outpt PT)     Assistance Recommended at Discharge None  Equipment Recommendations  Rollator (4 wheels)    Recommendations for Other Services       Precautions / Restrictions Precautions Precautions: None Precaution Comments: per Dr. Lajoyce Corners, unrestricted ROM to L shld and elbow Restrictions Weight Bearing Restrictions: No     Mobility  Bed Mobility Overal bed mobility: Independent                  Transfers Overall transfer level: Independent Equipment used: None               General transfer comment: no difficulty    Ambulation/Gait                   Stairs         General stair comments: didn't amb, focused on HEP as pt was indep with amb at eval   Wheelchair Mobility    Modified Rankin (Stroke Patients Only)       Balance Overall balance assessment: No apparent balance deficits (not formally assessed)                                          Cognition Arousal/Alertness: Awake/alert Behavior During Therapy: WFL for tasks assessed/performed Overall  Cognitive Status: Within Functional Limits for tasks assessed                                          Exercises Other Exercises Other Exercises: given HEP from [facility], spouse and pt with good return understanding of all exercises for elbow and shoulder, pt with forward shld flx and abd via towel on tray table, elbow flexion with forearm pronated, supinated, and in neutral, completed passive elbow extension over towel roll while supine in bed Other Exercises: passive stretching of L shld into flexion and abduction to about 90 deg prior to pt experiencing pain    General Comments General comments (skin integrity, edema, etc.): L elbow with guaze dressing without drainage and L axilla now without packing, no drainage noted      Pertinent Vitals/Pain Pain Assessment: 0-10 Pain Score: 8  Pain Location: 8/10 with ROM to L shld and elbow    Home Living                          Prior Function            PT  Goals (current goals can now be found in the care plan section) Acute Rehab PT Goals Patient Stated Goal: none Progress towards PT goals: Progressing toward goals    Frequency    Min 3X/week      PT Plan Current plan remains appropriate    Co-evaluation              AM-PAC PT "6 Clicks" Mobility   Outcome Measure  Help needed turning from your back to your side while in a flat bed without using bedrails?: None Help needed moving from lying on your back to sitting on the side of a flat bed without using bedrails?: None Help needed moving to and from a bed to a chair (including a wheelchair)?: None Help needed standing up from a chair using your arms (e.g., wheelchair or bedside chair)?: None Help needed to walk in hospital room?: None Help needed climbing 3-5 steps with a railing? : None 6 Click Score: 24    End of Session   Activity Tolerance: Patient tolerated treatment well Patient left: in bed;with call bell/phone within  reach;with family/visitor present Nurse Communication: Mobility status PT Visit Diagnosis: Pain;Other (comment) Pain - Right/Left: Left Pain - part of body: Shoulder     Time: 1157-2620 PT Time Calculation (min) (ACUTE ONLY): 13 min  Charges:  $Therapeutic Exercise: 8-22 mins                     Lewis Shock, PT, DPT Acute Rehabilitation Services Pager #: [phone] Office #: [phone]    Iona Hansen 06/08/2021, 10:19 AM

## 2021-06-08 NOTE — Progress Notes (Signed)
3 Days Post-Op   Subjective/Chief Complaint: Pain well controlled, elbow is more sore   Objective: Vital signs in last 24 hours: Temp:  [97.8 F (36.6 C)-98.2 F (36.8 C)] 97.8 F (36.6 C) (01/02 0731) Pulse Rate:  [72-86] 80 (01/02 0848) Resp:  [18-20] 20 (01/02 0731) BP: (81-113)/(51-74) 103/69 (01/02 0848) SpO2:  [95 %-97 %] 97 % (01/02 0848) Last BM Date: 06/07/21  Intake/Output from previous day: 01/01 0701 - 01/02 0700 In: 400 [IV Piggyback:400] Out: -  Intake/Output this shift: No intake/output data recorded.  Exam: Awake and alert Left axillary wound with minimal induration, appropriate tenderness, no active purulent drainage.  Left elbow wound with sutures in place and Penrose.  Lab Results:  Recent Labs    06/06/21 0205 06/07/21 0154  WBC 10.9* 9.6  HGB 12.1* 12.3*  HCT 39.0 40.0  PLT 322 384    BMET Recent Labs    06/06/21 0205 06/07/21 0154  NA 135 136  K 4.2 4.2  CL 104 102  CO2 22 26  GLUCOSE 114* 98  BUN 9 12  CREATININE 0.95 0.94  CALCIUM 8.5* 8.7*    PT/INR No results for input(s): LABPROT, INR in the last 72 hours. ABG No results for input(s): PHART, HCO3 in the last 72 hours.  Invalid input(s): PCO2, PO2  Studies/Results: No results found.  Anti-infectives: Anti-infectives (From admission, onward)    Start     Dose/Rate Route Frequency Ordered Stop   06/07/21 0000  amoxicillin-clavulanate (AUGMENTIN) 875-125 MG tablet        1 tablet Oral 2 times daily 06/07/21 1028 06/12/21 2359   06/07/21 0000  doxycycline (ADOXA) 100 MG tablet        100 mg Oral 2 times daily 06/07/21 1028 06/12/21 2359   06/06/21 2200  vancomycin (VANCOREADY) IVPB 1750 mg/350 mL        1,750 mg 175 mL/hr over 120 Minutes Intravenous Every 24 hours 06/06/21 0852     06/05/21 2200  vancomycin (VANCOREADY) IVPB 1250 mg/250 mL  Status:  Discontinued        1,250 mg 166.7 mL/hr over 90 Minutes Intravenous Every 24 hours 06/05/21 0334 06/06/21 0852    06/05/21 1200  ceFEPIme (MAXIPIME) 2 g in sodium chloride 0.9 % 100 mL IVPB        2 g 200 mL/hr over 30 Minutes Intravenous Every 8 hours 06/05/21 0329     06/05/21 0800  ceFAZolin (ANCEF) IVPB 2g/100 mL premix        2 g 200 mL/hr over 30 Minutes Intravenous On call to O.R. 06/05/21 0751 06/05/21 0853   06/05/21 0752  ceFAZolin (ANCEF) 2-4 GM/100ML-% IVPB       Note to Pharmacy: Effie Shy: cabinet override      06/05/21 0752 06/05/21 0853   06/05/21 0130  ceFEPIme (MAXIPIME) 2,000 mg in sodium chloride 0.9 % 100 mL IVPB        2,000 mg 200 mL/hr over 30 Minutes Intravenous  Once 06/05/21 0119 06/05/21 0506   06/05/21 0115  vancomycin (VANCOREADY) IVPB 1250 mg/250 mL        1,250 mg 166.7 mL/hr over 90 Minutes Intravenous  Once 06/05/21 0103 06/05/21 0425       Assessment/Plan: s/p Procedure(s): IRRIGATION AND DEBRIDEMENT ELBOW (Left) IRRIGATION AND DEBRIDEMENT AXILLARY ABSCESS (Left)  Packing removed from the axilla Only need to cover wound with dry gauze and change prn.  No need for re-packing From Victor general surgery standpoint, he can  go home on oral antibiotics when ready from Victor medical standpoint  The Penrose drain and sutures in his left elbow will be managed by the orthopedic surgeon. Per op note plan was to remove the Penrose drain 1 week postop.  Victor Richardson Victor Richardson 06/08/2021

## 2021-06-08 NOTE — Plan of Care (Signed)
  Problem: Education: Goal: Knowledge of General Education information will improve Description: Including pain rating scale, medication(s)/side effects and non-pharmacologic comfort measures Outcome: Adequate for Discharge   

## 2021-06-10 LAB — AEROBIC/ANAEROBIC CULTURE W GRAM STAIN (SURGICAL/DEEP WOUND)
Culture: NO GROWTH
Culture: NO GROWTH
Culture: NO GROWTH

## 2021-06-10 LAB — CULTURE, BLOOD (ROUTINE X 2)
Culture: NO GROWTH
Culture: NO GROWTH
Special Requests: ADEQUATE
Special Requests: ADEQUATE

## 2021-06-12 ENCOUNTER — Encounter: Payer: Self-pay | Admitting: Family

## 2021-06-12 ENCOUNTER — Ambulatory Visit (INDEPENDENT_AMBULATORY_CARE_PROVIDER_SITE_OTHER): Payer: BC Managed Care – PPO | Admitting: Family

## 2021-06-12 DIAGNOSIS — L02414 Cutaneous abscess of left upper limb: Secondary | ICD-10-CM

## 2021-06-12 NOTE — Progress Notes (Signed)
° °  Post-Op Visit Note   Patient: Victor Richardson           Date of Birth: 1985-12-19           MRN: [id number] Visit Date: 06/12/2021 PCP: Pcp, No  Chief Complaint: No chief complaint on file.   HPI:  HPI The patient is a 36 year old man seen 1 week status post I&D of the left elbow abscess he has been doing Daily dry dressing changes Ortho Exam On examination of the left elbow the incision is well approximated with sutures there is no gaping there is scant serosanguineous drainage there is mild surrounding tenderness no erythema no warmth  Visit Diagnoses: No diagnosis found.  Plan: He will continue with his daily Dial soap cleansing.  Dry dressing changes.  He will follow-up once more in 1 week for suture removal.  Follow-Up Instructions: Return in about 10 days (around 06/22/2021).   Imaging: No results found.  Orders:  No orders of the defined types were placed in this encounter.  No orders of the defined types were placed in this encounter.    PMFS History: Patient Active Problem List   Diagnosis Date Noted   Abscess of left arm 06/05/2021   Axillary abscess    Abscess of left elbow    Cellulitis of upper extremity 06/04/2021   Swelling of lymph node 06/04/2021   Medication monitoring encounter 06/04/2021   Past Medical History:  Diagnosis Date   Phimosis     Family History  Problem Relation Age of Onset   Healthy Mother     Past Surgical History:  Procedure Laterality Date   CIRCUMCISION N/A 01/06/2015   Procedure: CIRCUMCISION ADULT;  Surgeon: Ihor Gully, MD;  Location: [facility];  Service: Urology;  Laterality: N/A;   I & D EXTREMITY Left 06/05/2021   Procedure: IRRIGATION AND DEBRIDEMENT ELBOW;  Surgeon: Nadara Mustard, MD;  Location: [facility] OR;  Service: Orthopedics;  Laterality: Left;   IRRIGATION AND DEBRIDEMENT ABSCESS Left 06/05/2021   Procedure: IRRIGATION AND DEBRIDEMENT AXILLARY ABSCESS;  Surgeon: Emelia Loron, MD;  Location: [facility]  OR;  Service: General;  Laterality: Left;   NO PAST SURGERIES     Social History   Occupational History   Not on file  Tobacco Use   Smoking status: Never   Smokeless tobacco: Never  Vaping Use   Vaping Use: Never used  Substance and Sexual Activity   Alcohol use: No   Drug use: No   Sexual activity: Not on file

## 2021-06-26 ENCOUNTER — Ambulatory Visit: Payer: BC Managed Care – PPO | Admitting: Family

## 2021-06-26 ENCOUNTER — Encounter: Payer: Self-pay | Admitting: Family

## 2021-06-26 DIAGNOSIS — L02414 Cutaneous abscess of left upper limb: Secondary | ICD-10-CM

## 2021-06-26 NOTE — Progress Notes (Signed)
° °  Post-Op Visit Note   Patient: Victor Richardson           Date of Birth: 1985/07/29           MRN: [id number] Visit Date: 06/26/2021 PCP: Pcp, No  Chief Complaint:  Chief Complaint  Patient presents with   Left Elbow - Routine Post Op    06/05/21 I&D abscess     HPI:  HPI The patient is a 36 year old gentleman who is seen status post I&D of an abscess left upper arm Ortho Exam Incision is well-healed this is clean dry and intact sutures harvested today without incident there is no surrounding erythema no edema no sign of infection  Visit Diagnoses: No diagnosis found.  Plan: He may return to work on Monday as he requests he will follow-up once more in the office in 2 weeks if he is having any issues  Follow-Up Instructions: Return in about 13 days (around 07/09/2021), or c duda.   Imaging: No results found.  Orders:  No orders of the defined types were placed in this encounter.  No orders of the defined types were placed in this encounter.    PMFS History: Patient Active Problem List   Diagnosis Date Noted   Abscess of left arm 06/05/2021   Axillary abscess    Abscess of left elbow    Cellulitis of upper extremity 06/04/2021   Swelling of lymph node 06/04/2021   Medication monitoring encounter 06/04/2021   Past Medical History:  Diagnosis Date   Phimosis     Family History  Problem Relation Age of Onset   Healthy Mother     Past Surgical History:  Procedure Laterality Date   CIRCUMCISION N/A 01/06/2015   Procedure: CIRCUMCISION ADULT;  Surgeon: Ihor Gully, MD;  [facility];  Service: Urology;  Laterality: N/A;   I & D EXTREMITY Left 06/05/2021   Procedure: IRRIGATION AND DEBRIDEMENT ELBOW;  Surgeon: Nadara Mustard, MD;  Location: [facility] OR;  Service: Orthopedics;  Laterality: Left;   IRRIGATION AND DEBRIDEMENT ABSCESS Left 06/05/2021   Procedure: IRRIGATION AND DEBRIDEMENT AXILLARY ABSCESS;  Surgeon: Emelia Loron, MD;  Location: [facility] OR;   Service: General;  Laterality: Left;   NO PAST SURGERIES     Social History   Occupational History   Not on file  Tobacco Use   Smoking status: Never   Smokeless tobacco: Never  Vaping Use   Vaping Use: Never used  Substance and Sexual Activity   Alcohol use: No   Drug use: No   Sexual activity: Not on file

## 2021-07-02 ENCOUNTER — Other Ambulatory Visit: Payer: Self-pay

## 2021-07-02 ENCOUNTER — Encounter (INDEPENDENT_AMBULATORY_CARE_PROVIDER_SITE_OTHER): Payer: Self-pay | Admitting: Primary Care

## 2021-07-02 ENCOUNTER — Ambulatory Visit (INDEPENDENT_AMBULATORY_CARE_PROVIDER_SITE_OTHER): Payer: BC Managed Care – PPO | Admitting: Primary Care

## 2021-07-02 VITALS — BP 112/71 | HR 72 | Temp 97.8°F | Ht 63.0 in | Wt 146.0 lb

## 2021-07-02 DIAGNOSIS — Z09 Encounter for follow-up examination after completed treatment for conditions other than malignant neoplasm: Secondary | ICD-10-CM

## 2021-07-02 DIAGNOSIS — Z7689 Persons encountering health services in other specified circumstances: Secondary | ICD-10-CM

## 2021-07-02 NOTE — Progress Notes (Signed)
°  [facility]   Subjective:   [name] is a 36 y.o. male presents for hospital follow up and establish care. (Um sister is present at his appt and patient gave permission for sister to attend) . Patient present to UC then transferred to ED for evaluation and tx.  He accidentally stabbed himself with a screwdriver and tree thorn.  The wound was to the palm of his left hand. Appt with ID lead to an admission for Cellulitis of upper extremity. Admit date to the hospital was 06/04/21, patient was discharged from the hospital on 06/08/21, patient was admitted for: Abscess of left arm. Today he is feeling better but does notice his left upper arm were surgery perform feels itchy when moving. Scar on arm and armpit no s/s of infection.  Past Medical History:  Diagnosis Date   Phimosis      No Known Allergies    No current outpatient medications on file prior to visit.   No current facility-administered medications on file prior to visit.     Review of System: Comprehensive ROS Pertinent positive and negative noted in HPI   Objective:  BP 112/71 (BP Location: Right Arm, Patient Position: Sitting, Cuff Size: Normal)    Pulse 72    Temp 97.8 F (36.6 C) (Oral)    Ht 5\' 3"  (1.6 m)    Wt 146 lb (66.2 kg)    SpO2 95%    BMI 25.86 kg/m   Filed Weights   07/02/21 1341  Weight: 146 lb (66.2 kg)    Physical Exam: General Appearance: Well nourished, in no apparent distress. Eyes: PERRLA, EOMs, conjunctiva no swelling or erythema Sinuses: No Frontal/maxillary tenderness ENT/Mouth: Ext aud canals clear, TMs without erythema, bulging.  Hearing normal.  Neck: Supple, thyroid normal.  Respiratory: Respiratory effort normal, BS equal bilaterally without rales, rhonchi, wheezing or stridor.  Cardio: RRR with no MRGs. Brisk peripheral pulses without edema.  Abdomen: Soft, + BS.  Non tender, no guarding, rebound, hernias, masses. Lymphatics: Non tender without lymphadenopathy.   Musculoskeletal: Full ROM, 5/5 strength, normal gait.  Skin: Warm, dry without rashes, lesions, ecchymosis.  Neuro: Cranial nerves intact. Normal muscle tone, no cerebellar symptoms. Sensation intact.  Psych: Awake and oriented X 3, normal affect, Insight and Judgment appropriate.    Assessment:  Victor Richardson was seen today for hospitalization follow-up.  Diagnoses and all orders for this visit:  Hospital discharge follow-up Follow up with Victor Minion, MD (Orthopedic Surgery) in 1 week (06/15/2021) Call [place] (Family Medicine); for hospital follow up appointment and to establish for primary care Follow up with Victor Keens, MD (General Surgery) in 1 week (06/15/2021)  Encounter to establish care Establish care with PCP  This note has been created with Dragon speech recognition software and smart phrase technology. Any transcriptional errors are unintentional.   Kerin Perna, NP 07/02/2021, 1:43 PM

## 2021-07-13 ENCOUNTER — Ambulatory Visit: Payer: BC Managed Care – PPO | Admitting: Orthopedic Surgery

## 2021-12-30 ENCOUNTER — Encounter (INDEPENDENT_AMBULATORY_CARE_PROVIDER_SITE_OTHER): Payer: Self-pay | Admitting: Primary Care

## 2021-12-30 ENCOUNTER — Ambulatory Visit (INDEPENDENT_AMBULATORY_CARE_PROVIDER_SITE_OTHER): Payer: BC Managed Care – PPO | Admitting: Primary Care

## 2021-12-30 VITALS — BP 142/89 | HR 63 | Temp 97.8°F | Ht 63.0 in | Wt 151.0 lb

## 2021-12-30 DIAGNOSIS — R030 Elevated blood-pressure reading, without diagnosis of hypertension: Secondary | ICD-10-CM

## 2021-12-30 DIAGNOSIS — Z0001 Encounter for general adult medical examination with abnormal findings: Secondary | ICD-10-CM | POA: Diagnosis not present

## 2021-12-30 DIAGNOSIS — Z0000 Encounter for general adult medical examination without abnormal findings: Secondary | ICD-10-CM

## 2021-12-30 NOTE — Progress Notes (Signed)
Victor Richardson  Patient presents to clinic today for annual exam.  Patient is fasting for labs.  Acute Concerns: Grieving -random thoughts since father passed, stress with working and paying bills   Chronic Issues: None   Health Maintenance: Immunizations -- UTD Colon Cancer Screening -- N/A HIV/Hep C Screening -- not completed   Past Medical History:  Diagnosis Date   Phimosis     Past Surgical History:  Procedure Laterality Date   CIRCUMCISION N/A 01/06/2015   Procedure: CIRCUMCISION ADULT;  Surgeon: Kathie Rhodes, MD;  Location: [facility];  Service: Urology;  Laterality: N/A;   I & D EXTREMITY Left 06/05/2021   Procedure: IRRIGATION AND DEBRIDEMENT ELBOW;  Surgeon: Newt Minion, MD;  Location: [facility];  Service: Orthopedics;  Laterality: Left;   IRRIGATION AND DEBRIDEMENT ABSCESS Left 06/05/2021   Procedure: IRRIGATION AND DEBRIDEMENT AXILLARY ABSCESS;  Surgeon: Rolm Bookbinder, MD;  Location: [facility];  Service: General;  Laterality: Left;   NO PAST SURGERIES      No current outpatient medications on file prior to visit.   No current facility-administered medications on file prior to visit.    No Known Allergies  Family History  Problem Relation Age of Onset   Healthy Mother     Social History   Socioeconomic History   Marital status: Single    Spouse name: Not on file   Number of children: Not on file   Years of education: Not on file   Highest education level: Not on file  Occupational History   Not on file  Tobacco Use   Smoking status: Never   Smokeless tobacco: Never  Vaping Use   Vaping Use: Never used  Substance and Sexual Activity   Alcohol use: No   Drug use: No   Sexual activity: Not on file  Other Topics Concern   Not on file  Social History Narrative   Not on file   Social Determinants of Health   Financial Resource Strain: Not on file  Food Insecurity: Not on file  Transportation Needs: Not on  file  Physical Activity: Not on file  Stress: Not on file  Social Connections: Not on file  Intimate Partner Violence: Not on file    ROS Comprehensive ROS Pertinent positive and negative noted in HPI   BP (!) 142/89   Pulse 63   Temp 97.8 F (36.6 C) (Oral)   Ht 5' 3"  (1.6 m)   Wt 151 lb (68.5 kg)   SpO2 93%   BMI 26.75 kg/m   Physical Exam Vitals reviewed.  Constitutional:      Appearance: Normal appearance.  HENT:     Head: Normocephalic.     Right Ear: Tympanic membrane and external ear normal.     Left Ear: Tympanic membrane and external ear normal.     Nose: Nose normal.  Eyes:     Extraocular Movements: Extraocular movements intact.     Pupils: Pupils are equal, round, and reactive to light.  Cardiovascular:     Rate and Rhythm: Normal rate and regular rhythm.  Pulmonary:     Effort: Pulmonary effort is normal.     Breath sounds: Normal breath sounds.  Abdominal:     General: Abdomen is flat. Bowel sounds are normal.     Palpations: Abdomen is soft.  Musculoskeletal:        General: Normal range of motion.     Cervical back: Normal range of motion.  Skin:    General: Skin is warm and dry.  Neurological:     Mental Status: He is alert and oriented to person, place, and time.  Psychiatric:        Mood and Affect: Mood normal.        Behavior: Behavior normal.        Thought Content: Thought content normal.    Assessment/Plan: [name] was seen today for annual exam.  Diagnoses and all orders for this visit:  Annual physical exam -     CBC with Differential -     CMP14+EGFR  Elevated blood pressure reading in office without diagnosis of hypertension Discussed Counseled on blood pressure goal of less than 130/80, low-sodium, DASH diet,  150 minutes of moderate intensity exercise per week. Dietary diet - foods he eats -      This note has been created with Surveyor, quantity. Any transcriptional errors are  unintentional.   Kerin Perna, NP 12/30/2021, 10:32 AM

## 2021-12-30 NOTE — Patient Instructions (Signed)
Preventing Hypertension Hypertension, also called high blood pressure, is when the force of blood pumping through the arteries is too strong. Arteries are blood vessels that carry blood from the heart throughout the body. Often, hypertension does not cause symptoms until blood pressure is very high. It is important to have your blood pressure checked regularly. Diet and lifestyle changes can help you prevent hypertension, and they may make you feel better overall and improve your quality of life. If you already have hypertension, you may control it with diet and lifestyle changes, as well as with medicine. How can this condition affect me? Over time, hypertension can damage the arteries and decrease blood flow to important parts of the body, including the brain, heart, and kidneys. By keeping your blood pressure in a healthy range, you can help prevent complications like heart attack, heart failure, stroke, kidney failure, and vascular dementia. What can increase my risk? An unhealthy diet and a lack of physical activity can make you more likely to develop high blood pressure. Some other risk factors include: Age. The risk increases with age. Having family members who have had high blood pressure. Having certain health conditions, such as thyroid problems. Being overweight or obese. Drinking too much alcohol or caffeine. Having too much fat, sugar, calories, or salt (sodium) in your diet. Smoking or using illegal drugs. Taking certain medicines, such as antidepressants, decongestants, birth control pills, and NSAIDs, such as ibuprofen. What actions can I take to prevent or manage this condition? Work with your health care provider to make a hypertension prevention plan that works for you. You may be referred for counseling on a healthy diet and physical activity. Follow your plan and keep all follow-up visits. Diet changes Maintain a healthy diet. This includes: Eating less salt (sodium). Ask your  health care provider how much sodium is safe for you to have. The general recommendation is to have less than 1 tsp (2,300 mg) of sodium a day. Do not add salt to your food. Choose low-sodium options when grocery shopping and eating out. Limiting fats in your diet. You can do this by eating low-fat or fat-free dairy products and by eating less red meat. Eating more fruits, vegetables, and whole grains. Make a goal to eat: 1-2 cups of fresh fruits and vegetables each day. 3-4 servings of whole grains each day. Avoiding foods and beverages that have added sugars. Eating fish that contain healthy fats (omega-3 fatty acids), such as mackerel or salmon. If you need help putting together a healthy eating plan, try the DASH diet. This diet is high in fruits, vegetables, and whole grains. It is low in sodium, red meat, and added sugars. DASH stands for Dietary Approaches to Stop Hypertension. Lifestyle changes  Lose weight if you are overweight. Losing just 3-5% of your body weight can help prevent or control hypertension. For example, if your present weight is 200 lb (91 kg), a loss of 3-5% of your weight means losing 6-10 lb (2.7-4.5 kg). Ask your health care provider to help you with a diet and exercise plan to safely lose weight. Get enough exercise. Do at least 150 minutes of moderate-intensity exercise each week. You could do this in short exercise sessions several times a day, or you could do longer exercise sessions a few times a week. For example, you could take a brisk 10-minute walk or bike ride, 3 times a day, for 5 days a week. Find ways to reduce stress, such as exercising, meditating, listening to   music, or taking a yoga class. If you need help reducing stress, ask your health care provider. Do not use any products that contain nicotine or tobacco. These products include cigarettes, chewing tobacco, and vaping devices, such as e-cigarettes. Chemicals in tobacco and nicotine products raise your  blood pressure each time you use them. If you need help quitting, ask your health care provider. Learn how to check your blood pressure at home. Make sure that you know your personal target blood pressure, as told by your health care provider. Try to sleep 7-9 hours per night. Alcohol use Do not drink alcohol if: Your health care provider tells you not to drink. You are pregnant, may be pregnant, or are planning to become pregnant. If you drink alcohol: Limit how much you have to: 0-1 drink a day for women. 0-2 drinks a day for men. Know how much alcohol is in your drink. In the [place] one drink equals one 12 oz bottle of beer (355 mL), one 5 oz glass of wine (148 mL), or one 1 oz glass of hard liquor (44 mL). Medicines In addition to diet and lifestyle changes, your health care provider may recommend medicines to help lower your blood pressure. In general: You may need to try a few different medicines to find what works best for you. You may need to take more than one medicine. Take over-the-counter and prescription medicines only as told by your health care provider. Questions to ask your health care provider What is my blood pressure goal? How can I lower my risk for high blood pressure? How should I monitor my blood pressure at home? Where to find support Your health care provider can help you prevent hypertension and help you keep your blood pressure at a healthy level. Your local hospital or your community may also provide support services and prevention programs. The American Heart Association offers an online support network at supportnetwork.heart.org Where to find more information Learn more about hypertension from: National Heart, Lung, and Blood Institute: www.nhlbi.nih.gov Centers for Disease Control and Prevention: www.cdc.gov American Academy of Family Physicians: familydoctor.org Learn more about the DASH diet from: National Heart, Lung, and Blood Institute:  www.nhlbi.nih.gov Contact a health care provider if: You think you are having a reaction to medicines you have taken. You have recurrent headaches or feel dizzy. You have swelling in your ankles. You have trouble with your vision. Get help right away if: You have sudden, severe chest, back, or abdominal pain or discomfort. You have shortness of breath. You have a sudden, severe headache. These symptoms may be an emergency. Get help right away. Call 911. Do not wait to see if the symptoms will go away. Do not drive yourself to the hospital. Summary Hypertension often does not cause any symptoms until blood pressure is very high. It is important to get your blood pressure checked regularly. Diet and lifestyle changes are important steps in preventing hypertension. By keeping your blood pressure in a healthy range, you may prevent complications like heart attack, heart failure, stroke, and kidney failure. Work with your health care provider to make a hypertension prevention plan that works for you. This information is not intended to replace advice given to you by your health care provider. Make sure you discuss any questions you have with your health care provider. Document Revised: 03/12/2021 Document Reviewed: 03/12/2021 Elsevier Patient Education  2023 Elsevier Inc.  

## 2021-12-31 ENCOUNTER — Telehealth (INDEPENDENT_AMBULATORY_CARE_PROVIDER_SITE_OTHER): Payer: Self-pay

## 2021-12-31 LAB — CMP14+EGFR
ALT: 29 IU/L (ref 0–44)
AST: 24 IU/L (ref 0–40)
Albumin/Globulin Ratio: 1.5 (ref 1.2–2.2)
Albumin: 4.8 g/dL (ref 4.1–5.1)
Alkaline Phosphatase: 86 IU/L (ref 44–121)
BUN/Creatinine Ratio: 12 (ref 9–20)
BUN: 12 mg/dL (ref 6–20)
Bilirubin Total: 1.2 mg/dL (ref 0.0–1.2)
CO2: 25 mmol/L (ref 20–29)
Calcium: 9.5 mg/dL (ref 8.7–10.2)
Chloride: 101 mmol/L (ref 96–106)
Creatinine, Ser: 1.01 mg/dL (ref 0.76–1.27)
Globulin, Total: 3.1 g/dL (ref 1.5–4.5)
Glucose: 96 mg/dL (ref 70–99)
Potassium: 4.4 mmol/L (ref 3.5–5.2)
Sodium: 138 mmol/L (ref 134–144)
Total Protein: 7.9 g/dL (ref 6.0–8.5)
eGFR: 100 mL/min/{1.73_m2} (ref >59)

## 2021-12-31 LAB — CBC WITH DIFFERENTIAL/PLATELET
Basophils Absolute: 0 10*3/uL (ref 0.0–0.2)
Basos: 1 %
EOS (ABSOLUTE): 0.3 10*3/uL (ref 0.0–0.4)
Eos: 5 %
Hematocrit: 50.6 % (ref 37.5–51.0)
Hemoglobin: 16.2 g/dL (ref 13.0–17.7)
Immature Grans (Abs): 0 10*3/uL (ref 0.0–0.1)
Immature Granulocytes: 0 %
Lymphocytes Absolute: 2.3 10*3/uL (ref 0.7–3.1)
Lymphs: 41 %
MCH: 26.1 pg — ABNORMAL LOW (ref 26.6–33.0)
MCHC: 32 g/dL (ref 31.5–35.7)
MCV: 82 fL (ref 79–97)
Monocytes Absolute: 0.6 10*3/uL (ref 0.1–0.9)
Monocytes: 11 %
Neutrophils Absolute: 2.4 10*3/uL (ref 1.4–7.0)
Neutrophils: 42 %
Platelets: 229 10*3/uL (ref 150–450)
RBC: 6.21 x10E6/uL — ABNORMAL HIGH (ref 4.14–5.80)
RDW: 14.6 % (ref 11.6–15.4)
WBC: 5.7 10*3/uL (ref 3.4–10.8)

## 2021-12-31 NOTE — Telephone Encounter (Signed)
-----   Message from Grayce Sessions, NP sent at 12/31/2021  2:17 PM EDT ----- Labs are  normal. Try to drink at least 48 oz of water per day. Work on eating a low fat, heart healthy diet and participate in regular aerobic exercise program to control as well. Exercise at least  30 minutes per day-5 days per week. Monitor eating red meat, fried foods,  junk foods, sodas, sugary foods or drinks, unhealthy snacking, alcohol or smoking.

## 2021-12-31 NOTE — Telephone Encounter (Signed)
Patient is aware of normal lab results. Victor Richardson, CMA  ?

## 2022-02-10 ENCOUNTER — Encounter (INDEPENDENT_AMBULATORY_CARE_PROVIDER_SITE_OTHER): Payer: Self-pay | Admitting: Primary Care

## 2022-02-10 ENCOUNTER — Ambulatory Visit (INDEPENDENT_AMBULATORY_CARE_PROVIDER_SITE_OTHER): Payer: BC Managed Care – PPO | Admitting: Primary Care

## 2022-02-10 VITALS — BP 125/84 | HR 69 | Temp 98.1°F | Ht 63.0 in | Wt 146.0 lb

## 2022-02-10 DIAGNOSIS — Z1159 Encounter for screening for other viral diseases: Secondary | ICD-10-CM | POA: Diagnosis not present

## 2022-02-10 DIAGNOSIS — Z0130 Encounter for examination of blood pressure without abnormal findings: Secondary | ICD-10-CM

## 2022-02-10 NOTE — Progress Notes (Signed)
  [facility]  Victor Richardson, is a 36 y.o. male  BJS:[id number]  YWV:[id number]  DOB - 1985-06-16  Chief Complaint  Patient presents with   Blood Pressure Check       Subjective:   [name] is a 36 y.o. male here today for a follow up  for elevated  Bp- no medication change life style - stop eating fast foods daily , monitoring salt intake  and exercising. Patient has No headache, No chest pain, No abdominal pain - No Nausea, No new weakness tingling or numbness, No Cough - shortness of breath  No problems updated.  No Known Allergies  Past Medical History:  Diagnosis Date   Phimosis     No current outpatient medications on file prior to visit.   No current facility-administered medications on file prior to visit.    Objective:   Vitals:   02/10/22 1052  BP: 125/84  Pulse: 69  Temp: 98.1 F (36.7 C)  TempSrc: Oral  SpO2: 94%  Weight: 146 lb (66.2 kg)  Height: 5\' 3"  (1.6 m)    Exam General appearance : Awake, alert, not in any distress. Speech Clear. Not toxic looking HEENT: Atraumatic and Normocephalic, pupils equally reactive to light and accomodation Neck: Supple, no JVD. No cervical lymphadenopathy.  Chest: Good air entry bilaterally, no added sounds  CVS: S1 S2 regular, no murmurs.  Abdomen: Bowel sounds present, Non tender and not distended with no gaurding, rigidity or rebound. Extremities: B/L Lower Ext shows no edema, both legs are warm to touch Neurology: Awake alert, and oriented X 3, CN II-XII intact, Non focal Skin: No Rash  Data Review No results found for: "HGBA1C"  Assessment & Plan  Jaison was seen today for blood pressure check.  Diagnoses and all orders for this visit:  Encounter for hepatitis C screening test for low risk patient -     Cancel: Hepatitis C Antibody-Will obtain on follow-up   Blood pressure check Blood pressure is 125/84 on no medication changes to his lifestyle reduction in fast foods and cooking more  and making smarter choices with food intake and monitoring sodium and reading labels.    Patient have been counseled extensively about nutrition and exercise. Other issues discussed during this visit include: low cholesterol diet, weight control and daily exercise, foot care, annual eye examinations at [facility], importance of adherence with medications and regular follow-up. We also discussed long term complications of uncontrolled diabetes and hypertension.   Return for annual physical.  The patient was given clear instructions to go to ER or return to medical center if symptoms don't improve, worsen or new problems develop. The patient verbalized understanding. The patient was told to call to get lab results if they haven't heard anything in the next week.   This note has been created with . Any transcriptional errors are unintentional.   Education officer, environmental, NP 02/10/2022, 9:48 PM

## 2022-02-10 NOTE — Patient Instructions (Signed)
Lipoma  A lipoma is a noncancerous (benign) tumor that is made up of fat cells. This is a very common type of soft-tissue growth. Lipomas are usually found under the skin (subcutaneous). They may occur in any tissue of the body that contains fat. Common areas for lipomas to appear include the back, arms, shoulders, buttocks, and thighs. Lipomas grow slowly, and they are usually painless. Most lipomas do not cause problems and do not require treatment. What are the causes? The cause of this condition is not known. What increases the risk? You are more likely to develop this condition if: You are 40-60 years old. You have a family history of lipomas. What are the signs or symptoms? A lipoma usually appears as a small, round bump under the skin. In most cases, the lump will: Feel soft or rubbery. Not cause pain or other symptoms. However, if a lipoma is located in an area where it pushes on nerves, it can become painful or cause other symptoms. How is this diagnosed? A lipoma can usually be diagnosed with a physical exam. You may also have tests to confirm the diagnosis and to rule out other conditions. Tests may include: Imaging tests, such as a CT scan or an MRI. Removal of a tissue sample to be looked at under a microscope (biopsy). How is this treated? Treatment for this condition depends on the size of the lipoma and whether it is causing any symptoms. For small lipomas that are not causing problems, no treatment is needed. If a lipoma is bigger or it causes problems, surgery may be done to remove the lipoma. Lipomas can also be removed to improve appearance. Most often, the procedure is done after applying a medicine that numbs the area (local anesthetic). Liposuction may be done to reduce the size of the lipoma before it is removed through surgery, or it may be done to remove the lipoma. Lipomas are removed with this method to limit incision size and scarring. A liposuction tube is  inserted through a small incision into the lipoma, and the contents of the lipoma are removed through the tube with suction. Follow these instructions at home: Watch your lipoma for any changes. Keep all follow-up visits. This is important. Where to find more information OrthoInfo: orthoinfo.aaos.org Contact a health care provider if: Your lipoma becomes larger or hard. Your lipoma becomes painful, red, or increasingly swollen. These could be signs of infection or a more serious condition. Get help right away if: You develop tingling or numbness in an area near the lipoma. This could indicate that the lipoma is causing nerve damage. Summary A lipoma is a noncancerous tumor that is made up of fat cells. Most lipomas do not cause problems and do not require treatment. If a lipoma is bigger or it causes problems, surgery may be done to remove the lipoma. Contact a health care provider if your lipoma becomes larger or hard, or if it becomes painful, red, or increasingly swollen. These could be signs of infection or a more serious condition. This information is not intended to replace advice given to you by your health care provider. Make sure you discuss any questions you have with your health care provider. Document Revised: 06/12/2021 Document Reviewed: 06/12/2021 Elsevier Patient Education  2023 Elsevier Inc.  

## 2023-08-31 IMAGING — CT CT EXTREM UP ENTIRE ARM*L* W/ CM
2 of 7 series · 10 of 46 positions shown, 11 images · IV contrast (omnipaque)
Comparison: None.

CLINICAL DATA: Soft tissue infection suspected.

EXAM:
CT OF THE UPPER LEFT EXTREMITY WITH CONTRAST
TECHNIQUE: Multidetector CT imaging of the upper left extremity was performed
according to the standard protocol following intravenous contrast
administration.
CONTRAST:  80mL OMNIPAQUE IOHEXOL 350 MG/ML SOLN

[Series 7: thins · axial · 0.46mm/px · z∈[-756,-219]mm · 7 of 1003 slices shown, 8 images]
[im 118/1003  soft-tissue]
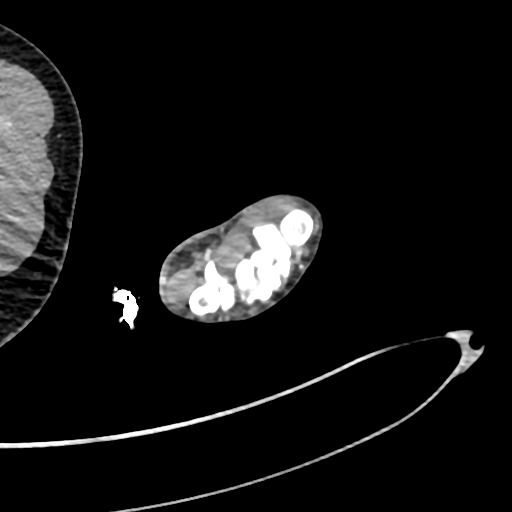
[im 118/1003  bone]
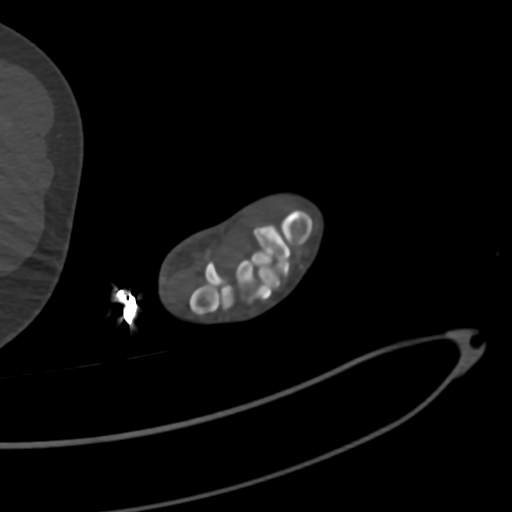
[im 236/1003  soft-tissue]
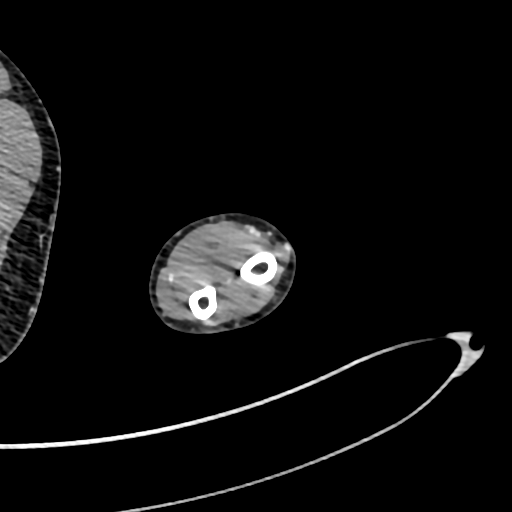
[im 354/1003  soft-tissue]
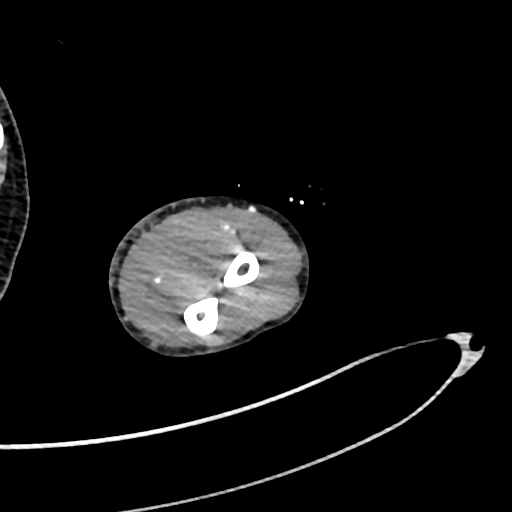
[im 531/1003  soft-tissue]
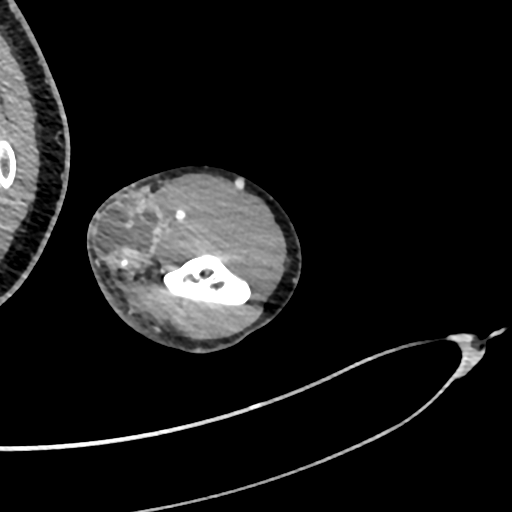
[im 649/1003  soft-tissue]
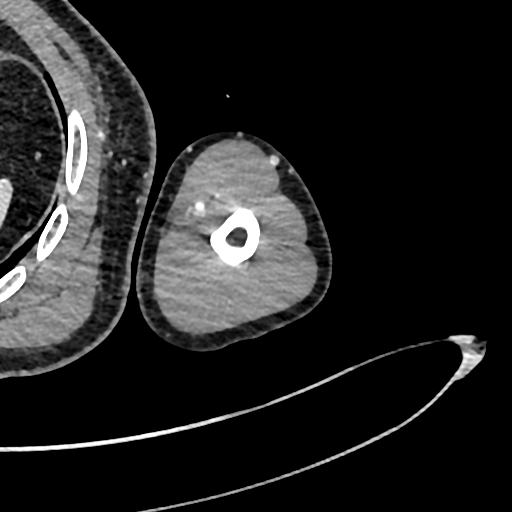
[im 767/1003  soft-tissue]
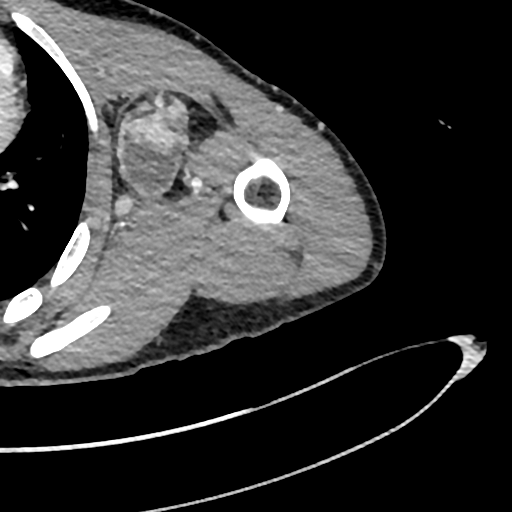
[im 885/1003  soft-tissue]
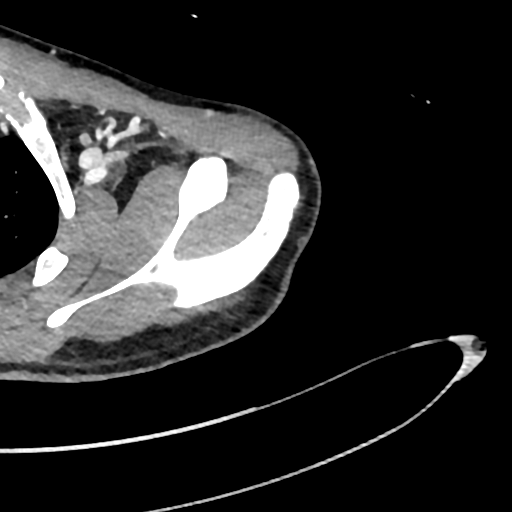

[Series 8: cor · coronal · 0.36mm/px · 3 of 96 slices shown]
[im 24/96  soft-tissue]
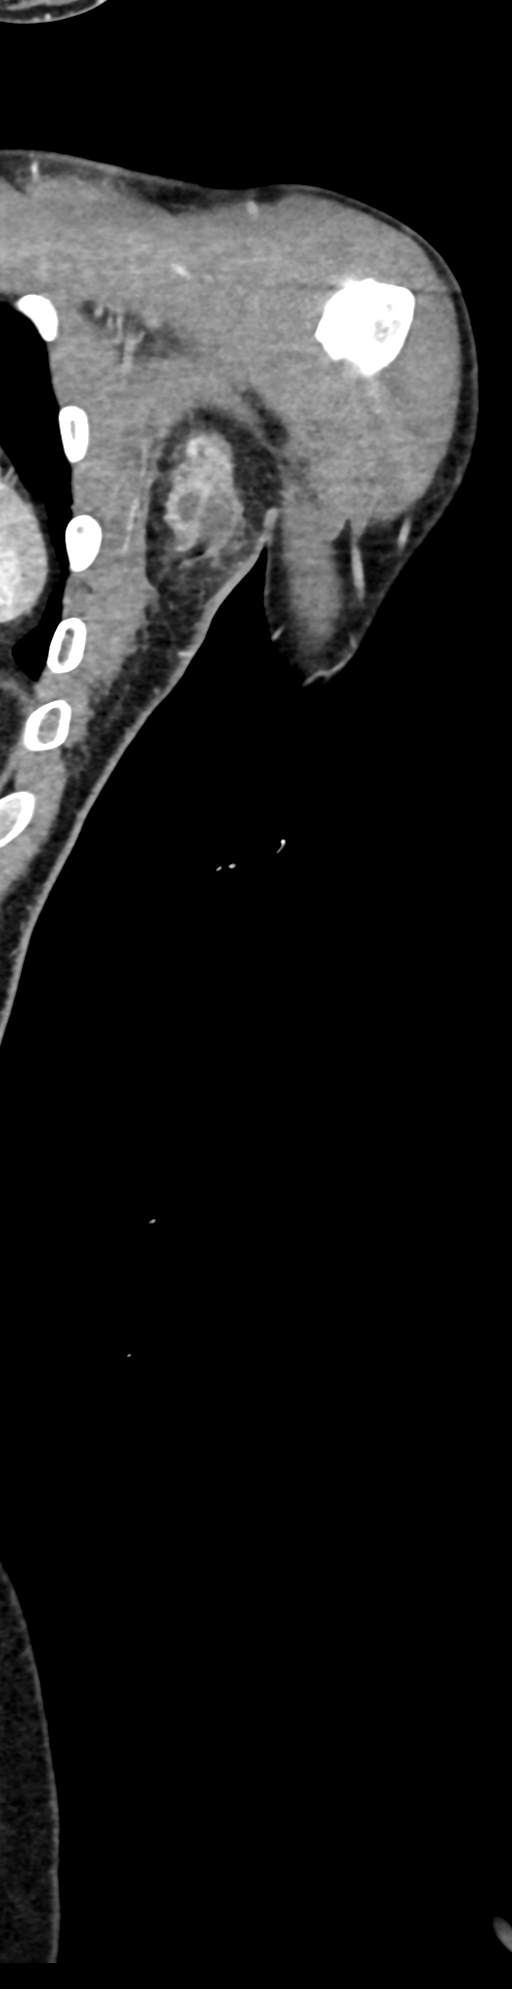
[im 48/96  soft-tissue]
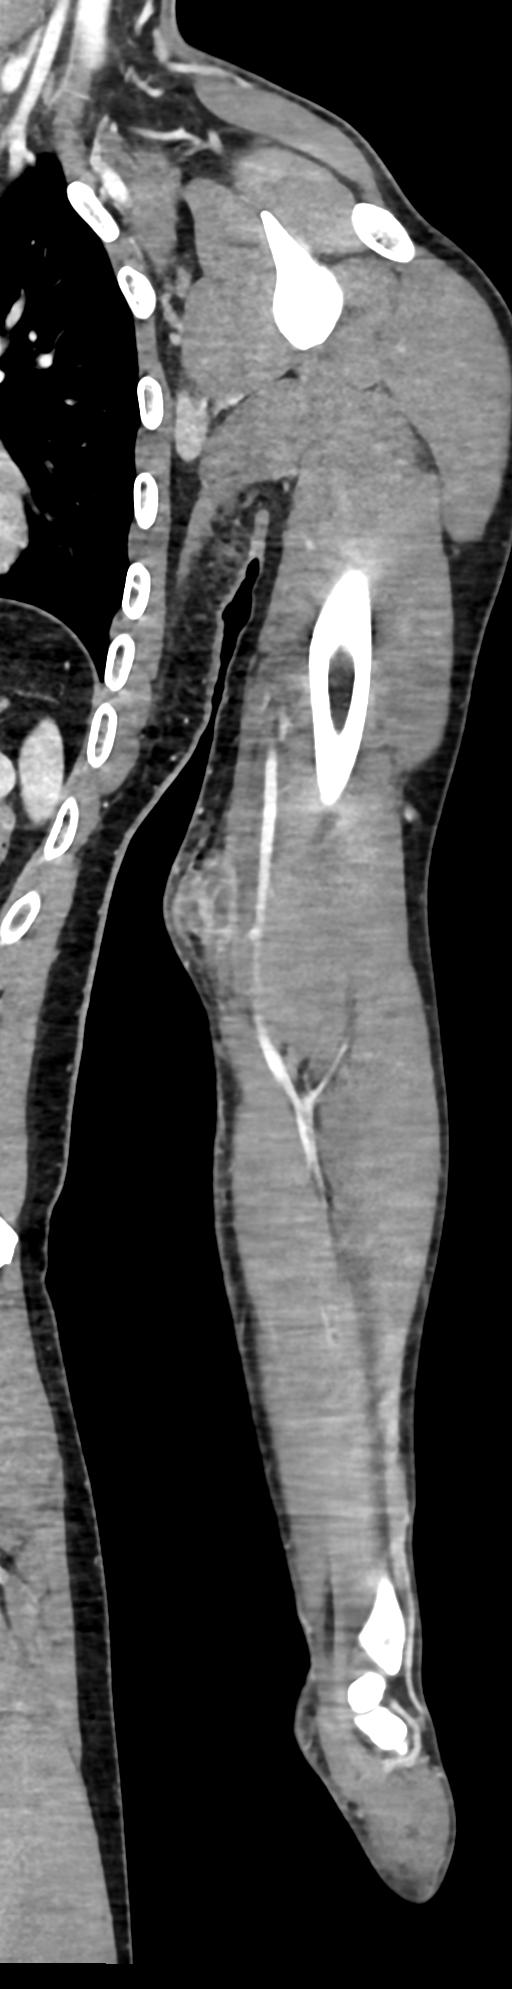
[im 72/96  soft-tissue]
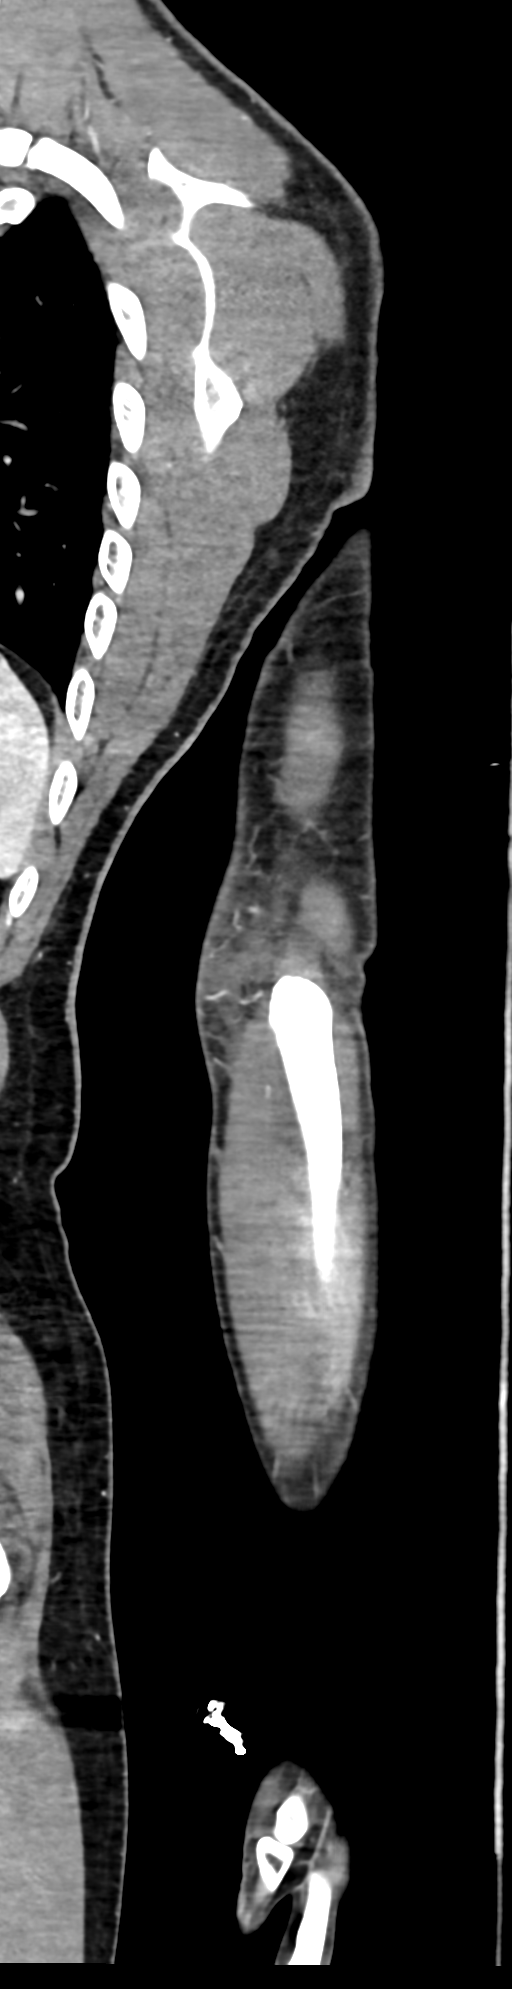

[10 of 46 positions shown; findings below may reference images not displayed]

FINDINGS: Bones/Joint/Cartilage

No acute fracture or dislocation. The bones are well mineralized. No
arthritic changes. No joint effusion. The visualized major arteries
of the left upper extremity appear patent.

Ligaments

Suboptimally assessed by CT.

Muscles and Tendons

No intramuscular hematoma fluid collection.

Soft tissues

There is a complex collection with enhancing walls in the
subcutaneous soft tissues of the left axilla measuring 5.6 x 3.7 cm
in greatest axial dimensions and 7.7 cm in craniocaudal length most
consistent with an abscess.

Additional complex collection with enhancing walls measure 3.3 x
x 4.2 cm in the subcutaneous and superficial soft tissues of the
medial distal arm.
IMPRESSION: 1. No acute fracture or dislocation.
2. Abscesses of the left axilla and left upper extremity.

## 2024-07-03 ENCOUNTER — Ambulatory Visit (HOSPITAL_COMMUNITY)
Admission: EM | Admit: 2024-07-03 | Discharge: 2024-07-03 | Disposition: A | Discharge status: Home / Self Care | Attending: Family Medicine | Admitting: Family Medicine

## 2024-07-03 ENCOUNTER — Encounter (HOSPITAL_COMMUNITY): Payer: Self-pay

## 2024-07-03 DIAGNOSIS — J029 Acute pharyngitis, unspecified: Secondary | ICD-10-CM | POA: Insufficient documentation

## 2024-07-03 LAB — POCT RAPID STREP A (OFFICE): Rapid Strep A Screen: NEGATIVE

## 2024-07-03 NOTE — ED Provider Notes (Signed)
 " [facility]    CSN: [id number] Arrival date & time: 07/03/24  1236      History   Chief Complaint Chief Complaint  Patient presents with   Sore Throat    HPI Victor Richardson is a 39 y.o. male.    Sore Throat   Patient is here for a sore throat, headache, fevers/chills for about a week.  He has not checked his temp but just felt hot.  He has runny nose, mucus, congestion as well.  Took nyquil for his symptoms.  No known exposures to strep/flu that he is aware.  No n/v.       Past Medical History:  Diagnosis Date   Phimosis     Patient Active Problem List   Diagnosis Date Noted   Abscess of left arm 06/05/2021   Axillary abscess    Abscess of left elbow    Cellulitis of upper extremity 06/04/2021   Swelling of lymph node 06/04/2021   Medication monitoring encounter 06/04/2021    Past Surgical History:  Procedure Laterality Date   CIRCUMCISION N/A 01/06/2015   Procedure: CIRCUMCISION ADULT;  Surgeon: Mark Ottelin, MD;  Location: [facility];  Service: Urology;  Laterality: N/A;   I & D EXTREMITY Left 06/05/2021   Procedure: IRRIGATION AND DEBRIDEMENT ELBOW;  Surgeon: Harden Jerona GAILS, MD;  Location: [facility] OR;  Service: Orthopedics;  Laterality: Left;   IRRIGATION AND DEBRIDEMENT ABSCESS Left 06/05/2021   Procedure: IRRIGATION AND DEBRIDEMENT AXILLARY ABSCESS;  Surgeon: Ebbie Cough, MD;  Location: [facility] OR;  Service: General;  Laterality: Left;   NO PAST SURGERIES         Home Medications    Prior to Admission medications  Not on File    Family History Family History  Problem Relation Age of Onset   Healthy Mother     Social History Social History[1]   Allergies   Patient has no known allergies.   Review of Systems Review of Systems  Constitutional: Negative.   HENT:  Positive for congestion, rhinorrhea and sore throat.   Respiratory: Negative.    Cardiovascular: Negative.   Gastrointestinal: Negative.    Genitourinary: Negative.   Musculoskeletal: Negative.   Psychiatric/Behavioral: Negative.       Physical Exam Triage Vital Signs ED Triage Vitals  Encounter Vitals Group     BP 07/03/24 1515 123/83     Girls Systolic BP Percentile --      Girls Diastolic BP Percentile --      Boys Systolic BP Percentile --      Boys Diastolic BP Percentile --      Pulse Rate 07/03/24 1515 86     Resp 07/03/24 1515 18     Temp 07/03/24 1515 98 F (36.7 C)     Temp src --      SpO2 07/03/24 1515 96 %     Weight --      Height --      Head Circumference --      Peak Flow --      Pain Score 07/03/24 1514 0     Pain Loc --      Pain Education --      Exclude from Growth Chart --    No data found.  Updated Vital Signs BP 123/83   Pulse 86   Temp 98 F (36.7 C)   Resp 18   SpO2 96%   Visual Acuity Right Eye Distance:   Left Eye Distance:  Bilateral Distance:    Right Eye Near:   Left Eye Near:    Bilateral Near:     Physical Exam Constitutional:      General: He is not in acute distress.    Appearance: He is well-developed and normal weight. He is not ill-appearing or toxic-appearing.  HENT:     Nose: Congestion and rhinorrhea present.     Mouth/Throat:     Mouth: Mucous membranes are moist.     Pharynx: Posterior oropharyngeal erythema present. No pharyngeal swelling or oropharyngeal exudate.  Cardiovascular:     Rate and Rhythm: Normal rate and regular rhythm.     Heart sounds: Normal heart sounds.  Pulmonary:     Effort: Pulmonary effort is normal.     Breath sounds: Normal breath sounds.  Musculoskeletal:     Cervical back: Normal range of motion and neck supple.  Lymphadenopathy:     Cervical: No cervical adenopathy.  Skin:    General: Skin is warm.  Neurological:     General: No focal deficit present.     Mental Status: He is alert.  Psychiatric:        Mood and Affect: Mood normal.      UC Treatments / Results  Labs (all labs ordered are listed, but  only abnormal results are displayed) Labs Reviewed  POCT RAPID STREP A (OFFICE) - Normal  CULTURE, GROUP A STREP [facility])  POCT RAPID STREP A (OFFICE)    EKG   Radiology No results found.  Procedures Procedures (including critical care time)  Medications Ordered in UC Medications - No data to display  Initial Impression / Assessment and Plan / UC Course  I have reviewed the triage vital signs and the nursing notes.  Pertinent labs & imaging results that were available during my care of the patient were reviewed by me and considered in my medical decision making (see chart for details).    Final Clinical Impressions(s) / UC Diagnoses   Final diagnoses:  Sore throat  Pharyngitis, unspecified etiology     Discharge Instructions      You were seen today for sore throat.   Your rapid strep was negative.  This will be sent for culture and you will be notified if this is positive.  This is likely due to a virus/drainage causing symptoms.  I recommend you take over the counter claritin/zyrtec for sinus drainage, as well as flonase nasal spray.  You may also trial mucinex over the counter.  Please use tylenol , and warm salt water gargles as needed.  Return if not improving or worsening.     ED Prescriptions   None    PDMP not reviewed this encounter.     [1]  Social History Tobacco Use   Smoking status: Never   Smokeless tobacco: Never  Vaping Use   Vaping status: Never Used  Substance Use Topics   Alcohol use: No   Drug use: No     Darral Longs, MD 07/03/24 1533  "

## 2024-07-03 NOTE — Discharge Instructions (Addendum)
 You were seen today for sore throat.   Your rapid strep was negative.  This will be sent for culture and you will be notified if this is positive.  This is likely due to a virus/drainage causing symptoms.  I recommend you take over the counter claritin/zyrtec for sinus drainage, as well as flonase nasal spray.  You may also trial mucinex over the counter.  Please use tylenol , and warm salt water gargles as needed.  Return if not improving or worsening.

## 2024-07-03 NOTE — ED Triage Notes (Signed)
 Pt presents with sore throat, fever and headache x 1 week. Reports a cough that is productive.

## 2024-07-05 LAB — CULTURE, GROUP A STREP (THRC)
